# Patient Record
Sex: Female | Born: 1990 | Race: White | Hispanic: Yes | Marital: Single | Smoking: Never smoker
Health system: Southern US, Community
[De-identification: ages and names within clinical notes are randomized; demographics above are authoritative.]

## PROBLEM LIST (undated history)

## (undated) DIAGNOSIS — Z789 Other specified health status: Secondary | ICD-10-CM

## (undated) HISTORY — PX: NO PAST SURGERIES: SHX2092

---
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
## 2016-02-10 ENCOUNTER — Encounter: Payer: Self-pay | Admitting: *Deleted

## 2016-02-10 ENCOUNTER — Ambulatory Visit
Admission: EM | Admit: 2016-02-10 | Discharge: 2016-02-10 | Disposition: A | Discharge status: Home / Self Care | Payer: 59 | Attending: Family Medicine | Admitting: Family Medicine

## 2016-02-10 DIAGNOSIS — Z202 Contact with and (suspected) exposure to infections with a predominantly sexual mode of transmission: Secondary | ICD-10-CM

## 2016-02-10 LAB — URINALYSIS COMPLETE WITH MICROSCOPIC (ARMC ONLY)
BILIRUBIN URINE: NEGATIVE
GLUCOSE, UA: NEGATIVE mg/dL
Ketones, ur: NEGATIVE mg/dL
LEUKOCYTES UA: NEGATIVE
NITRITE: NEGATIVE
pH: 7 (ref 5.0–8.0)

## 2016-02-10 LAB — PREGNANCY, URINE: Preg Test, Ur: NEGATIVE

## 2016-02-10 MED ORDER — CEFTRIAXONE SODIUM 250 MG IJ SOLR
250.0000 mg | Freq: Once | INTRAMUSCULAR | Status: AC
  Administered 2016-02-10: 250 mg via INTRAMUSCULAR

## 2016-02-10 MED ORDER — AZITHROMYCIN 500 MG PO TABS
1000.0000 mg | ORAL_TABLET | Freq: Once | ORAL | Status: AC
  Administered 2016-02-10: 1000 mg via ORAL

## 2016-02-10 MED ORDER — AZITHROMYCIN 500 MG PO TABS: 1000.0000 mg | ORAL_TABLET | Freq: Every day | ORAL | Status: DC

## 2016-02-10 NOTE — Discharge Instructions (Signed)
Follow up with your primary care physician or Health Department in 7-10 days as discussed. No sexually activity until follow up.   Return to Urgent care for new or worsening concerns.    Sexually Transmitted Disease A sexually transmitted disease (STD) is a disease or infection that may be passed (transmitted) from person to person, usually during sexual activity. This may happen by way of saliva, semen, blood, vaginal mucus, or urine. Common STDs include:  Gonorrhea.  Chlamydia.  Syphilis.  HIV and AIDS.  Genital herpes.  Hepatitis B and C.  Trichomonas.  Human papillomavirus (HPV).  Pubic lice.  Scabies.  Mites.  Bacterial vaginosis. WHAT ARE CAUSES OF STDs? An STD may be caused by bacteria, a virus, or parasites. STDs are often transmitted during sexual activity if one person is infected. However, they may also be transmitted through nonsexual means. STDs may be transmitted after:   Sexual intercourse with an infected person.  Sharing sex toys with an infected person.  Sharing needles with an infected person or using unclean piercing or tattoo needles.  Having intimate contact with the genitals, mouth, or rectal areas of an infected person.  Exposure to infected fluids during birth. WHAT ARE THE SIGNS AND SYMPTOMS OF STDs? Different STDs have different symptoms. Some people may not have any symptoms. If symptoms are present, they may include:  Painful or bloody urination.  Pain in the pelvis, abdomen, vagina, anus, throat, or eyes.  A skin rash, itching, or irritation.  Growths, ulcerations, blisters, or sores in the genital and anal areas.  Abnormal vaginal discharge with or without bad odor.  Penile discharge in men.  Fever.  Pain or bleeding during sexual intercourse.  Swollen glands in the groin area.  Yellow skin and eyes (jaundice). This is seen with hepatitis.  Swollen testicles.  Infertility.  Sores and blisters in the mouth. HOW  ARE STDs DIAGNOSED? To make a diagnosis, your health care provider may:  Take a medical history.  Perform a physical exam.  Take a sample of any discharge to examine.  Swab the throat, cervix, opening to the penis, rectum, or vagina for testing.  Test a sample of your first morning urine.  Perform blood tests.  Perform a Pap test, if this applies.  Perform a colposcopy.  Perform a laparoscopy. HOW ARE STDs TREATED? Treatment depends on the STD. Some STDs may be treated but not cured.  Chlamydia, gonorrhea, trichomonas, and syphilis can be cured with antibiotic medicine.  Genital herpes, hepatitis, and HIV can be treated, but not cured, with prescribed medicines. The medicines lessen symptoms.  Genital warts from HPV can be treated with medicine or by freezing, burning (electrocautery), or surgery. Warts may come back.  HPV cannot be cured with medicine or surgery. However, abnormal areas may be removed from the cervix, vagina, or vulva.  If your diagnosis is confirmed, your recent sexual partners need treatment. This is true even if they are symptom-free or have a negative culture or evaluation. They should not have sex until their health care providers say it is okay.  Your health care provider may test you for infection again 3 months after treatment. HOW CAN I REDUCE MY RISK OF GETTING AN STD? Take these steps to reduce your risk of getting an STD:  Use latex condoms, dental dams, and water-soluble lubricants during sexual activity. Do not use petroleum jelly or oils.  Avoid having multiple sex partners.  Do not have sex with someone who has other sex  partners  Do not have sex with anyone you do not know or who is at high risk for an STD.  Avoid risky sex practices that can break your skin.  Do not have sex if you have open sores on your mouth or skin.  Avoid drinking too much alcohol or taking illegal drugs. Alcohol and drugs can affect your judgment and put you  in a vulnerable position.  Avoid engaging in oral and anal sex acts.  Get vaccinated for HPV and hepatitis. If you have not received these vaccines in the past, talk to your health care provider about whether one or both might be right for you.  If you are at risk of being infected with HIV, it is recommended that you take a prescription medicine daily to prevent HIV infection. This is called pre-exposure prophylaxis (PrEP). You are considered at risk if:  You are a man who has sex with other men (MSM).  You are a heterosexual man or woman and are sexually active with more than one partner.  You take drugs by injection.  You are sexually active with a partner who has HIV.  Talk with your health care provider about whether you are at high risk of being infected with HIV. If you choose to begin PrEP, you should first be tested for HIV. You should then be tested every 3 months for as long as you are taking PrEP. WHAT SHOULD I DO IF I THINK I HAVE AN STD?  See your health care provider.  Tell your sexual partner(s). They should be tested and treated for any STDs.  Do not have sex until your health care provider says it is okay. WHEN SHOULD I GET IMMEDIATE MEDICAL CARE? Contact your health care provider right away if:   You have severe abdominal pain.  You are a man and notice swelling or pain in your testicles.  You are a woman and notice swelling or pain in your vagina.   This information is not intended to replace advice given to you by your health care provider. Make sure you discuss any questions you have with your health care provider.   Document Released: 01/02/2003 Document Revised: 11/02/2014 Document Reviewed: 05/02/2013 Elsevier Interactive Patient Education [employer]

## 2016-02-10 NOTE — ED Provider Notes (Addendum)
[facility]  ____________________________________________  Time seen: Approximately 8:33 PM  I have reviewed the triage vital signs and the nursing notes.   HISTORY  Chief Complaint Exposure to STD   HPI Bethany Wright is a 25 y.o. female presents with a complaint of STD exposure. Patient reports that her boyfriend was informed today that he tested positive for chlamydia 3 days ago. Patient reports she did not have any symptoms. Patient reports that she is currently sexually active with 1 partner. Patient reports that she has been sexually active with the same partner for the last 7 months. Patient states that they do not use condoms. Patient denies any oral contraceptives or other birth control method. Patient reports last sexual encounter was 2-3 weeks ago. Patient reports that she is currently on her menstrual at this time.  Denies any pain, vaginal pain, vaginal discharge, vaginal odor, vaginal rash or lesions, abdominal pain, fevers, chest pain, shortness of breath, neck pain, back pain, dysuria, rash or other complaints. Denies history of STDs.   Patient's last menstrual period was 02/08/2016. Current. Denies concern for pregnancy.    History reviewed. No pertinent past medical history. denies There are no active problems to display for this patient. denies  History reviewed. No pertinent past surgical history. denies No current outpatient prescriptions on file. denies Allergies Review of patient's allergies indicates no known allergies.  No family history on file.  Social History Social History  Substance Use Topics  . Smoking status: Never Smoker   . Smokeless tobacco: None  . Alcohol Use: No    Review of Systems Constitutional: No fever/chills Eyes: No visual changes. ENT: No sore throat. Cardiovascular: Denies chest pain. Respiratory: Denies shortness of breath. Gastrointestinal: No abdominal pain.  No nausea, no vomiting.  No diarrhea.  No  constipation. Genitourinary: Negative for dysuria. Musculoskeletal: Negative for back pain. Skin: Negative for rash. Neurological: Negative for headaches, focal weakness or numbness.  10-point ROS otherwise negative.  ____________________________________________   PHYSICAL EXAM:  VITAL SIGNS: ED Triage Vitals  Enc Vitals Group     BP 02/10/16 1838 135/97 mmHg     Pulse Rate 02/10/16 1838 63     Resp -- 18     Temp 02/10/16 1838 98.1 F (36.7 C)     Temp Source 02/10/16 1838 Oral     SpO2 02/10/16 1838 100 %     Weight 02/10/16 1838 180 lb (81.647 kg)     Height 02/10/16 1838 5' 7.5" (1.715 m)     Head Cir --      Peak Flow --      Pain Score --      Pain Loc --      Pain Edu? --      Excl. in GC? --     Constitutional: Alert and oriented. Well appearing and in no acute distress. Eyes: Conjunctivae are normal. PERRL. EOMI. Head: Atraumatic.  Ears: no erythema, normal TMs bilaterally.   Nose: No congestion/rhinnorhea.  Mouth/Throat: Mucous membranes are moist.  Oropharynx non-erythematous. Neck: No stridor.  No cervical spine tenderness to palpation. Hematological/Lymphatic/Immunilogical: No cervical lymphadenopathy. Cardiovascular: Normal rate, regular rhythm. Grossly normal heart sounds.  Good peripheral circulation. Respiratory: Normal respiratory effort.  No retractions. Lungs CTAB. Gastrointestinal: Soft and nontender. Normal Bowel sounds.  No CVA tenderness. Pelvic : Patient refused  Musculoskeletal: No lower or upper extremity tenderness nor edema.  Neurologic:  Normal speech and language. No gross focal neurologic deficits are appreciated. No gait instability. Skin:  Skin is warm, dry and intact. No rash noted. Psychiatric: Mood and affect are normal. Speech and behavior are normal.  ____________________________________________   LABS (all labs ordered are listed, but only abnormal results are displayed)  Labs Reviewed  URINALYSIS COMPLETEWITH  MICROSCOPIC ([facility] ONLY) - Abnormal; Notable for the following:    Specific Gravity, Urine >1.030 (*)    Hgb urine dipstick 2+ (*)    Protein, ur TRACE (*)    Bacteria, UA FEW (*)    Squamous Epithelial / LPF 0-5 (*)    All other components within normal limits  CHLAMYDIA/NGC RT PCR ([facility] ONLY)  URINE CULTURE  PREGNANCY, URINE  HIV ANTIBODY (ROUTINE TESTING)  RPR  HSV(HERPES SIMPLEX VRS) I + II AB-IGG  HSV(HERPES SIMPLEX VRS) I + II AB-IGM    INITIAL IMPRESSION / ASSESSMENT AND PLAN / ED COURSE  Pertinent labs & imaging results that were available during my care of the patient were reviewed by me and considered in my medical decision making (see chart for details).  Very well-appearing patient. No acute distress. Presents with a complaint of STD exposure. Patient presented her boyfriend has a positive for chlamydia. Patient denies any complaints or symptoms. As patient with positive exposure will treat with 250 mg IM Rocephin as well as 1000 mg oral azithromycin once in urgent care. Discussed testing for other STDs as well. Patient denies any symptoms. Patient states that she is currently on her menstrual cycle and does not want a pelvic exam performed. Discussed evaluation and discussed that pelvic slides would be more accurate in testing, patient states that she does not want a pelvic exam at this time. Will treat for chlamydia. Will also evaluate for gonorrhea and chlamydia by urine, RPR, herpes, HIV.    Urinalysis positive for few bacteria, 0-5 squamous epithelial cells, 0-5 WBCs with clear appearance. Too numerous to count RBCs as well as 2+hemoglobin present. Patient is currently on menstrual cycle. Suspect contamination. As patient with out dysuria symptoms will await urinary culture prior to initiating treatment.  Discussed in detail with patient regarding pelvic rest and no sexual intercourse for at least 2 weeks until follow-up to ensure clearance. Encourage patient to  follow-up with primary care physician or [facility] Department and 7-10 days. Also discussed as patient states that she does not want to become pregnant at this time, encouraged patient to follow-up regarding oral birth control which she states she is interested in as well as encouraged barrier protection. Encouraged close follow-up as discussed.  Discussed follow up with Primary care physician this week. Discussed follow up and return parameters including no resolution or any worsening concerns. Patient verbalized understanding and agreed to plan.   ____________________________________________   FINAL CLINICAL IMPRESSION(S) / ED DIAGNOSES  Final diagnoses:  STD exposure      Note: This dictation was prepared with Dragon dictation along with smaller phrase technology. Any transcriptional errors that result from this process are unintentional.    Renford Dills, NP 02/10/16 2123  Renford Dills, NP 02/13/16 [phone]

## 2016-02-10 NOTE — ED Notes (Signed)
Pt states that her boyfriend tested positive for chlamydia, Pt states that she does not have any symptoms but would like to be tested

## 2016-02-11 LAB — CHLAMYDIA/NGC RT PCR (ARMC ONLY)
Chlamydia Tr: DETECTED — AB
N GONORRHOEAE: NOT DETECTED

## 2016-02-12 LAB — URINE CULTURE

## 2016-02-12 LAB — RPR: RPR Ser Ql: NONREACTIVE

## 2016-02-12 LAB — HIV ANTIBODY (ROUTINE TESTING W REFLEX): HIV Screen 4th Generation wRfx: NONREACTIVE

## 2016-02-12 LAB — HSV(HERPES SIMPLEX VRS) I + II AB-IGG: HSV 1 GLYCOPROTEIN G AB, IGG: 24.9 {index} — AB (ref 0.00–0.90)

## 2016-02-12 LAB — HSV(HERPES SIMPLEX VRS) I + II AB-IGM: HSVI/II COMB AB IGM: 4.43 ratio — AB (ref 0.00–0.90)

## 2016-02-13 ENCOUNTER — Telehealth: Payer: Self-pay | Admitting: Emergency Medicine

## 2016-02-13 MED ORDER — VALACYCLOVIR HCL 1 G PO TABS: 1000.0000 mg | ORAL_TABLET | Freq: Two times a day (BID) | ORAL | Status: AC

## 2016-02-13 NOTE — ED Notes (Signed)
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  Patient was instructed to go ahead and start this medicine and to take it as directed for 10 days.  Patient was also instructed to follow-up with her PCP.  Patient verbalized understanding.

## 2017-10-28 DIAGNOSIS — B372 Candidiasis of skin and nail: Secondary | ICD-10-CM | POA: Diagnosis not present

## 2017-11-28 DIAGNOSIS — J069 Acute upper respiratory infection, unspecified: Secondary | ICD-10-CM | POA: Diagnosis not present

## 2018-01-17 DIAGNOSIS — M9902 Segmental and somatic dysfunction of thoracic region: Secondary | ICD-10-CM | POA: Diagnosis not present

## 2018-01-17 DIAGNOSIS — M9901 Segmental and somatic dysfunction of cervical region: Secondary | ICD-10-CM | POA: Diagnosis not present

## 2018-01-17 DIAGNOSIS — M542 Cervicalgia: Secondary | ICD-10-CM | POA: Diagnosis not present

## 2018-01-17 DIAGNOSIS — M546 Pain in thoracic spine: Secondary | ICD-10-CM | POA: Diagnosis not present

## 2018-01-21 DIAGNOSIS — M542 Cervicalgia: Secondary | ICD-10-CM | POA: Diagnosis not present

## 2018-01-21 DIAGNOSIS — M9901 Segmental and somatic dysfunction of cervical region: Secondary | ICD-10-CM | POA: Diagnosis not present

## 2018-01-21 DIAGNOSIS — M546 Pain in thoracic spine: Secondary | ICD-10-CM | POA: Diagnosis not present

## 2018-01-21 DIAGNOSIS — M9902 Segmental and somatic dysfunction of thoracic region: Secondary | ICD-10-CM | POA: Diagnosis not present

## 2018-01-28 DIAGNOSIS — M9901 Segmental and somatic dysfunction of cervical region: Secondary | ICD-10-CM | POA: Diagnosis not present

## 2018-01-28 DIAGNOSIS — M9902 Segmental and somatic dysfunction of thoracic region: Secondary | ICD-10-CM | POA: Diagnosis not present

## 2018-01-28 DIAGNOSIS — M542 Cervicalgia: Secondary | ICD-10-CM | POA: Diagnosis not present

## 2018-01-28 DIAGNOSIS — M546 Pain in thoracic spine: Secondary | ICD-10-CM | POA: Diagnosis not present

## 2018-05-25 DIAGNOSIS — J039 Acute tonsillitis, unspecified: Secondary | ICD-10-CM | POA: Diagnosis not present

## 2018-05-25 DIAGNOSIS — J069 Acute upper respiratory infection, unspecified: Secondary | ICD-10-CM | POA: Diagnosis not present

## 2018-05-25 DIAGNOSIS — J029 Acute pharyngitis, unspecified: Secondary | ICD-10-CM | POA: Diagnosis not present

## 2018-08-12 DIAGNOSIS — Z23 Encounter for immunization: Secondary | ICD-10-CM | POA: Diagnosis not present

## 2018-08-24 DIAGNOSIS — S8991XA Unspecified injury of right lower leg, initial encounter: Secondary | ICD-10-CM | POA: Diagnosis not present

## 2019-02-22 ENCOUNTER — Other Ambulatory Visit: Payer: Self-pay

## 2019-02-22 ENCOUNTER — Encounter: Payer: Self-pay | Admitting: Obstetrics & Gynecology

## 2019-02-22 ENCOUNTER — Ambulatory Visit (INDEPENDENT_AMBULATORY_CARE_PROVIDER_SITE_OTHER): Payer: BLUE CROSS/BLUE SHIELD | Admitting: Obstetrics & Gynecology

## 2019-02-22 VITALS — BP 130/80 | Ht 67.5 in | Wt 223.0 lb

## 2019-02-22 DIAGNOSIS — R350 Frequency of micturition: Secondary | ICD-10-CM | POA: Diagnosis not present

## 2019-02-22 DIAGNOSIS — Z3049 Encounter for surveillance of other contraceptives: Secondary | ICD-10-CM

## 2019-02-22 DIAGNOSIS — Z3046 Encounter for surveillance of implantable subdermal contraceptive: Secondary | ICD-10-CM

## 2019-02-22 LAB — POCT URINALYSIS DIPSTICK
Bilirubin, UA: NEGATIVE
Blood, UA: NEGATIVE
Glucose, UA: NEGATIVE
Ketones, UA: NEGATIVE
Leukocytes, UA: NEGATIVE
Nitrite, UA: NEGATIVE
Protein, UA: NEGATIVE
Spec Grav, UA: 1.01 (ref 1.010–1.025)
Urobilinogen, UA: 0.2 E.U./dL
pH, UA: 5 (ref 5.0–8.0)

## 2019-02-22 NOTE — Progress Notes (Signed)
  Nexplanon removal Procedure note - The Nexplanon was noted in the patient's arm and the end was identified. The skin was cleansed with a Betadine solution. A small injection of subcutaneous lidocaine with epinephrine was given over the end of the implant. An incision was made at the end of the implant. The rod was noted in the incision and grasped with a hemostat. It was noted to be intact.  Steri-Strip was placed approximating the incision. Hemostasis was noted.  Pt is counseled as to birth control.  No pregnancy desired as of yet.  Wants to avoid hormonal options for a while.  Paraguard discussed as option.   She had rare period on Nexplanon and wants to see what her periods will do naturally at this time.  Letitia Libra ,MD 02/22/2019,3:07 PM

## 2019-02-22 NOTE — Progress Notes (Signed)
  HPI:      [name] is a 28 y.o. G1P1001 who LMP was Patient's last menstrual period was 10/23/2018., presents today for a problem visit.    Urinary Tract Infection: Patient complains of frequency and urgency . She has had symptoms for 3 days. Patient also complains of no other complaint. Patient denies fever and dysuria. Patient does not have a history of recurrent UTI.  Patient does not have a history of pyelonephritis.   PMHx: She  has no past medical history on file. Also,  has no past surgical history on file., family history includes Hypertension in her mother.,  reports that she has never smoked. She has never used smokeless tobacco. She reports that she does not drink alcohol or use drugs.  She currently has no medications in their medication list. Also, has No Known Allergies.  Review of Systems  Constitutional: Negative for chills, fever and malaise/fatigue.  HENT: Negative for congestion, sinus pain and sore throat.   Eyes: Negative for blurred vision and pain.  Respiratory: Negative for cough and wheezing.   Cardiovascular: Negative for chest pain and leg swelling.  Gastrointestinal: Negative for abdominal pain, constipation, diarrhea, heartburn, nausea and vomiting.  Genitourinary: Negative for dysuria, frequency, hematuria and urgency.  Musculoskeletal: Negative for back pain, joint pain, myalgias and neck pain.  Skin: Negative for itching and rash.  Neurological: Negative for dizziness, tremors and weakness.  Endo/Heme/Allergies: Does not bruise/bleed easily.  Psychiatric/Behavioral: Negative for depression. The patient is not nervous/anxious and does not have insomnia.     Objective: BP 130/80   Ht 5' 7.5" (1.715 m)   Wt 223 lb (101.2 kg)   LMP 10/23/2018   BMI 34.41 kg/m  Physical Exam Constitutional:      General: She is not in acute distress.    Appearance: She is well-developed.  Musculoskeletal: Normal range of motion.  Neurological:     Mental  Status: She is alert and oriented to person, place, and time.  Skin:    General: Skin is warm and dry.  Vitals signs reviewed.   Back no CVAT  Results for orders placed or performed in visit on 02/22/19  POCT urinalysis dipstick  Result Value Ref Range   Color, UA     Clarity, UA     Glucose, UA Negative Negative   Bilirubin, UA neg    Ketones, UA neg    Spec Grav, UA 1.010 1.010 - 1.025   Blood, UA neg    pH, UA 5.0 5.0 - 8.0   Protein, UA Negative Negative   Urobilinogen, UA 0.2 0.2 or 1.0 E.U./dL   Nitrite, UA neg    Leukocytes, UA Negative Negative   Appearance     Odor      ASSESSMENT/PLAN:  Urgency and Frequency normal urology findings, no UTI  Problem List Items Addressed This Visit    Urine frequency    -  Primary   Relevant Orders   POCT urinalysis dipstick (Completed)   Nexplanon removal                   See Note  Plan to monitor urinary sx's for now.  Plan no BC, considering Paraguard to avoid hormones for a while  Annamarie Major, MD, [facility], [facility] 02/22/2019  3:09 PM

## 2019-04-03 ENCOUNTER — Ambulatory Visit (INDEPENDENT_AMBULATORY_CARE_PROVIDER_SITE_OTHER): Payer: BC Managed Care – PPO | Admitting: Obstetrics & Gynecology

## 2019-04-03 ENCOUNTER — Other Ambulatory Visit: Payer: Self-pay

## 2019-04-03 ENCOUNTER — Other Ambulatory Visit (HOSPITAL_COMMUNITY)
Admission: RE | Admit: 2019-04-03 | Discharge: 2019-04-03 | Disposition: A | Discharge status: Home / Self Care | Payer: BC Managed Care – PPO | Source: Ambulatory Visit | Attending: Obstetrics & Gynecology | Admitting: Obstetrics & Gynecology

## 2019-04-03 ENCOUNTER — Encounter: Payer: Self-pay | Admitting: Obstetrics & Gynecology

## 2019-04-03 VITALS — BP 130/80 | Ht 67.5 in | Wt 222.0 lb

## 2019-04-03 DIAGNOSIS — Z124 Encounter for screening for malignant neoplasm of cervix: Secondary | ICD-10-CM | POA: Insufficient documentation

## 2019-04-03 DIAGNOSIS — Z01419 Encounter for gynecological examination (general) (routine) without abnormal findings: Secondary | ICD-10-CM

## 2019-04-03 NOTE — Progress Notes (Signed)
HPI:      Bethany Wright is a 28 y.o. G1P1001 who LMP was Patient's last menstrual period was 03/14/2019., she presents today for her annual examination. The patient has no complaints today. The patient is sexually active. Her last pap: was normal. The patient does perform self breast exams.  There is no notable family history of breast or ovarian cancer in her family.  The patient has regular exercise: yes.  The patient denies current symptoms of depression.    GYN History: Contraception: condoms  PMHx: History reviewed. No pertinent past medical history. History reviewed. No pertinent surgical history. Family History  Problem Relation Age of Onset  . Hypertension Mother    Social History   Tobacco Use  . Smoking status: Never Smoker  . Smokeless tobacco: Never Used  Substance Use Topics  . Alcohol use: No  . Drug use: No   No current outpatient medications on file. Allergies: Patient has no known allergies.  Review of Systems  Constitutional: Negative for chills, fever and malaise/fatigue.  HENT: Negative for congestion, sinus pain and sore throat.   Eyes: Negative for blurred vision and pain.  Respiratory: Negative for cough and wheezing.   Cardiovascular: Negative for chest pain and leg swelling.  Gastrointestinal: Negative for abdominal pain, constipation, diarrhea, heartburn, nausea and vomiting.  Genitourinary: Negative for dysuria, frequency, hematuria and urgency.  Musculoskeletal: Negative for back pain, joint pain, myalgias and neck pain.  Skin: Negative for itching and rash.  Neurological: Negative for dizziness, tremors and weakness.  Endo/Heme/Allergies: Does not bruise/bleed easily.  Psychiatric/Behavioral: Negative for depression. The patient is not nervous/anxious and does not have insomnia.     Objective: BP 130/80   Ht 5' 7.5" (1.715 m)   Wt 222 lb (100.7 kg)   LMP 03/14/2019   BMI 34.26 kg/m   Filed Weights   04/03/19 1338  Weight: 222 lb  (100.7 kg)   Body mass index is 34.26 kg/m. Physical Exam Constitutional:      General: She is not in acute distress.    Appearance: She is well-developed.  Genitourinary:     Pelvic exam was performed with patient supine.     Vagina, uterus and rectum normal.     No lesions in the vagina.     No vaginal bleeding.     No cervical motion tenderness, friability, lesion or polyp.     Uterus is mobile.     Uterus is not enlarged.     No uterine mass detected.    Uterus is midaxial.     No right or left adnexal mass present.     Right adnexa not tender.     Left adnexa not tender.  HENT:     Head: Normocephalic and atraumatic. No laceration.     Right Ear: Hearing normal.     Left Ear: Hearing normal.     Mouth/Throat:     Pharynx: Uvula midline.  Eyes:     Pupils: Pupils are equal, round, and reactive to light.  Neck:     Musculoskeletal: Normal range of motion and neck supple.     Thyroid: No thyromegaly.  Cardiovascular:     Rate and Rhythm: Normal rate and regular rhythm.     Heart sounds: No murmur. No friction rub. No gallop.   Pulmonary:     Effort: Pulmonary effort is normal. No respiratory distress.     Breath sounds: Normal breath sounds. No wheezing.  Chest:  Breasts:        Right: No mass, skin change or tenderness.        Left: No mass, skin change or tenderness.  Abdominal:     General: Bowel sounds are normal. There is no distension.     Palpations: Abdomen is soft.     Tenderness: There is no abdominal tenderness. There is no rebound.  Musculoskeletal: Normal range of motion.  Neurological:     Mental Status: She is alert and oriented to person, place, and time.     Cranial Nerves: No cranial nerve deficit.  Skin:    General: Skin is warm and dry.  Psychiatric:        Judgment: Judgment normal.  Vitals signs reviewed.     Assessment:  ANNUAL EXAM 1. Women's annual routine gynecological examination   2. Screening for cervical cancer       Screening Plan:            1.  Cervical Screening-  Pap smear done today  2. Breast screening- Exam annually and mammogram>40 planned   3. Labs managed by PCP  4. Counseling for contraception: condoms  Prior Nexplanon, but likes to have periods Prior Mirena, too much irreg bleeding Considering Paraguard in future    F/U  Return in about 1 year (around 04/02/2020) for Annual.  Annamarie MajorPaul Legend Tumminello, MD, [facility], [facility] 04/03/2019  1:56 PM

## 2019-04-06 ENCOUNTER — Telehealth: Payer: Self-pay

## 2019-04-06 LAB — CYTOLOGY - PAP
Adequacy: ABSENT
Diagnosis: NEGATIVE

## 2019-04-06 NOTE — Telephone Encounter (Signed)
Yes I left message with her to let us know if she has BV sx's (otherwise PAP is normal No Cancer).

## 2019-04-06 NOTE — Telephone Encounter (Signed)
Pt calling triage stating she seen her abnormal pap results, all I see is possible Bv ? Please advise

## 2019-04-10 ENCOUNTER — Other Ambulatory Visit: Payer: Self-pay | Admitting: Obstetrics & Gynecology

## 2019-04-10 MED ORDER — METRONIDAZOLE 0.75 % VA GEL: 1.0000 | Freq: Every day | VAGINAL | 0 refills | Status: DC

## 2019-04-10 NOTE — Telephone Encounter (Signed)
Can you send in rx for BV, pt reports discharge with slight odor

## 2019-04-19 ENCOUNTER — Telehealth: Payer: Self-pay

## 2019-04-19 NOTE — Telephone Encounter (Signed)
Pt needs appt if still having sx/issues.

## 2019-04-19 NOTE — Telephone Encounter (Signed)
Pt calling; finished 5d course of metrogel on Fri.  Now feels like she has something else b/c it's different.  [phone]  Pt states yesterday she started feeling like a burning and has white clumpy d/c.

## 2019-04-19 NOTE — Telephone Encounter (Signed)
Yes

## 2019-04-20 NOTE — Telephone Encounter (Signed)
Adv pt she needs to be seen; no availability this week; in meantime can use monistat. Tx'd to SP to schedule.

## 2019-04-25 ENCOUNTER — Encounter: Payer: Self-pay | Admitting: Obstetrics and Gynecology

## 2019-04-25 ENCOUNTER — Other Ambulatory Visit: Payer: Self-pay

## 2019-04-25 ENCOUNTER — Ambulatory Visit (INDEPENDENT_AMBULATORY_CARE_PROVIDER_SITE_OTHER): Payer: BC Managed Care – PPO | Admitting: Obstetrics and Gynecology

## 2019-04-25 VITALS — BP 118/78 | Ht 67.0 in | Wt 226.0 lb

## 2019-04-25 DIAGNOSIS — N898 Other specified noninflammatory disorders of vagina: Secondary | ICD-10-CM | POA: Diagnosis not present

## 2019-04-25 LAB — POCT WET PREP WITH KOH
Clue Cells Wet Prep HPF POC: NEGATIVE
KOH Prep POC: NEGATIVE
Trichomonas, UA: NEGATIVE
Yeast Wet Prep HPF POC: NEGATIVE

## 2019-04-25 NOTE — Progress Notes (Signed)
Patient, No Pcp Per   Chief Complaint  Patient presents with  . Vaginitis    pt still having vaginal d/c after medication.     HPI:      Ms. Phyllicia Dudek is a 28 y.o. G1P1001 who LMP was Patient's last menstrual period was 04/11/2019., presents today for vaginal d/c without irritation/odor since last wk. Pt treated for BV with metrogel 04/10/19 after pt had sx and pap suggestive of BV. Pt states odor resolved but then had vaginal irritation and d/c that was white and clumpy. Didn't feel like yeast vag but treated with monistat-3 with sx relief of irritation. Still has d/c, but not excessive. Pt wants to make sure no infection.  Pt is sex active, using condoms. Same partner for 4 yrs.   History reviewed. No pertinent past medical history.  History reviewed. No pertinent surgical history.  Family History  Problem Relation Age of Onset  . Hypertension Mother     Social History   Socioeconomic History  . Marital status: Single    Spouse name: Not on file  . Number of children: Not on file  . Years of education: Not on file  . Highest education level: Not on file  Occupational History  . Not on file  Social Needs  . Financial resource strain: Not on file  . Food insecurity    Worry: Not on file    Inability: Not on file  . Transportation needs    Medical: Not on file    Non-medical: Not on file  Tobacco Use  . Smoking status: Never Smoker  . Smokeless tobacco: Never Used  Substance and Sexual Activity  . Alcohol use: No  . Drug use: No  . Sexual activity: Yes  Lifestyle  . Physical activity    Days per week: Not on file    Minutes per session: Not on file  . Stress: Not on file  Relationships  . Social Herbalist on phone: Not on file    Gets together: Not on file    Attends religious service: Not on file    Active member of club or organization: Not on file    Attends meetings of clubs or organizations: Not on file    Relationship status: Not  on file  . Intimate partner violence    Fear of current or ex partner: Not on file    Emotionally abused: Not on file    Physically abused: Not on file    Forced sexual activity: Not on file  Other Topics Concern  . Not on file  Social History Narrative  . Not on file    No outpatient medications prior to visit.   No facility-administered medications prior to visit.       ROS:  Review of Systems  Constitutional: Negative for fever.  Gastrointestinal: Negative for blood in stool, constipation, diarrhea, nausea and vomiting.  Genitourinary: Positive for vaginal discharge. Negative for dyspareunia, dysuria, flank pain, frequency, hematuria, urgency, vaginal bleeding and vaginal pain.  Musculoskeletal: Negative for back pain.  Skin: Negative for rash.   BREAST: No symptoms   OBJECTIVE:   Vitals:  BP 118/78   Ht 5\' 7"  (1.702 m)   Wt 226 lb (102.5 kg)   LMP 04/11/2019   BMI 35.40 kg/m   Physical Exam Vitals signs reviewed.  Constitutional:      Appearance: She is well-developed.  Neck:     Musculoskeletal: Normal range of motion.  Pulmonary:  Effort: Pulmonary effort is normal.  Genitourinary:    General: Normal vulva.     Pubic Area: No rash.      Labia:        Right: No rash, tenderness or lesion.        Left: No rash, tenderness or lesion.      Vagina: Normal. No vaginal discharge, erythema or tenderness.     Cervix: Normal.     Uterus: Normal. Not enlarged and not tender.      Adnexa: Right adnexa normal and left adnexa normal.       Right: No mass or tenderness.         Left: No mass or tenderness.    Musculoskeletal: Normal range of motion.  Skin:    General: Skin is warm and dry.  Neurological:     General: No focal deficit present.     Mental Status: She is alert and oriented to person, place, and time.  Psychiatric:        Mood and Affect: Mood normal.        Behavior: Behavior normal.        Thought Content: Thought content normal.         Judgment: Judgment normal.     Results: Results for orders placed or performed in visit on 04/25/19 (from the past 24 hour(s))  POCT Wet Prep with KOH     Status: Normal   Collection Time: 04/25/19 11:58 AM  Result Value Ref Range   Trichomonas, UA Negative    Clue Cells Wet Prep HPF POC neg    Epithelial Wet Prep HPF POC     Yeast Wet Prep HPF POC neg    Bacteria Wet Prep HPF POC     RBC Wet Prep HPF POC     WBC Wet Prep HPF POC     KOH Prep POC Negative Negative     Assessment/Plan: Vaginal discharge - Plan: POCT Wet Prep with KOH, Neg wet prep/ no other sx. Reassurance. F/u if sx change/prn. Can check culture if needed.      Return if symptoms worsen or fail to improve.  Froilan Mclean B. Sher Shampine, PA-C 04/25/2019 11:59 AM

## 2019-04-25 NOTE — Patient Instructions (Signed)
I value your feedback and entrusting us with your care. If you get a [facility] patient survey, I would appreciate you taking the time to let us know about your experience today. Thank you! 

## 2019-05-16 DIAGNOSIS — J039 Acute tonsillitis, unspecified: Secondary | ICD-10-CM | POA: Diagnosis not present

## 2019-06-15 ENCOUNTER — Other Ambulatory Visit: Payer: Self-pay

## 2019-06-15 ENCOUNTER — Telehealth: Payer: Self-pay

## 2019-06-15 DIAGNOSIS — Z20822 Contact with and (suspected) exposure to covid-19: Secondary | ICD-10-CM

## 2019-06-15 NOTE — Telephone Encounter (Signed)
Pt called stating she is experiencing the same symptoms she had about a month ago that ended up being BV and was wondering if their was something she could take for it or if she needed to come into the office to see ABC to check on it. Please advise, Thank you

## 2019-06-16 ENCOUNTER — Other Ambulatory Visit: Payer: Self-pay | Admitting: Obstetrics and Gynecology

## 2019-06-16 LAB — NOVEL CORONAVIRUS, NAA: SARS-CoV-2, NAA: NOT DETECTED

## 2019-06-16 MED ORDER — METRONIDAZOLE 0.75 % VA GEL
1.0000 | Freq: Every day | VAGINAL | 0 refills | Status: AC
Start: 2019-06-16 — End: 2019-06-21

## 2019-06-16 NOTE — Telephone Encounter (Signed)
Rx RF metrogel eRxd. Common to get recurrent sx within 3-6 months of BV. Add probiotics. F/u prn.

## 2019-06-16 NOTE — Progress Notes (Signed)
Rx RF metrogel for recurrent BV sx 

## 2019-06-16 NOTE — Telephone Encounter (Signed)
Pt aware.

## 2019-06-19 ENCOUNTER — Other Ambulatory Visit: Payer: Self-pay

## 2019-06-19 DIAGNOSIS — Z20822 Contact with and (suspected) exposure to covid-19: Secondary | ICD-10-CM

## 2019-06-21 LAB — NOVEL CORONAVIRUS, NAA: SARS-CoV-2, NAA: DETECTED — AB

## 2019-07-06 ENCOUNTER — Other Ambulatory Visit: Payer: Self-pay

## 2019-07-06 DIAGNOSIS — Z20822 Contact with and (suspected) exposure to covid-19: Secondary | ICD-10-CM

## 2019-07-07 LAB — NOVEL CORONAVIRUS, NAA: SARS-CoV-2, NAA: NOT DETECTED

## 2020-04-09 ENCOUNTER — Encounter: Payer: Self-pay | Admitting: Obstetrics & Gynecology

## 2020-04-09 ENCOUNTER — Other Ambulatory Visit (HOSPITAL_COMMUNITY)
Admission: RE | Admit: 2020-04-09 | Discharge: 2020-04-09 | Disposition: A | Discharge status: Home / Self Care | Payer: BC Managed Care – PPO | Source: Ambulatory Visit | Attending: Obstetrics & Gynecology | Admitting: Obstetrics & Gynecology

## 2020-04-09 ENCOUNTER — Ambulatory Visit: Payer: Self-pay | Admitting: Obstetrics & Gynecology

## 2020-04-09 ENCOUNTER — Ambulatory Visit (INDEPENDENT_AMBULATORY_CARE_PROVIDER_SITE_OTHER): Payer: BC Managed Care – PPO | Admitting: Obstetrics & Gynecology

## 2020-04-09 ENCOUNTER — Other Ambulatory Visit: Payer: Self-pay

## 2020-04-09 VITALS — BP 120/70 | Ht 67.5 in | Wt 236.0 lb

## 2020-04-09 DIAGNOSIS — N760 Acute vaginitis: Secondary | ICD-10-CM | POA: Diagnosis not present

## 2020-04-09 DIAGNOSIS — Z01419 Encounter for gynecological examination (general) (routine) without abnormal findings: Secondary | ICD-10-CM | POA: Diagnosis not present

## 2020-04-09 DIAGNOSIS — B9689 Other specified bacterial agents as the cause of diseases classified elsewhere: Secondary | ICD-10-CM | POA: Diagnosis present

## 2020-04-09 DIAGNOSIS — Z124 Encounter for screening for malignant neoplasm of cervix: Secondary | ICD-10-CM | POA: Diagnosis present

## 2020-04-09 NOTE — Progress Notes (Signed)
HPI:      [name] is a 29 y.o. G1P1001 who LMP was Patient's last menstrual period was 03/29/2020., she presents today for her annual examination. The patient has no complaints today. The patient is sexually active. Periods reg. Her last pap: was normal. The patient does perform self breast exams.  There is no notable family history of breast or ovarian cancer in her family.  The patient has regular exercise: yes.  The patient denies current symptoms of depression.    GYN History: Contraception: condoms  PMHx: History reviewed. No pertinent past medical history. History reviewed. No pertinent surgical history. Family History  Problem Relation Age of Onset  . Hypertension Mother    Social History   Tobacco Use  . Smoking status: Never Smoker  . Smokeless tobacco: Never Used  Vaping Use  . Vaping Use: Never used  Substance Use Topics  . Alcohol use: No  . Drug use: No   No current outpatient medications on file. Allergies: Patient has no known allergies.  Review of Systems  Constitutional: Negative for chills, fever and malaise/fatigue.  HENT: Negative for congestion, sinus pain and sore throat.   Eyes: Negative for blurred vision and pain.  Respiratory: Negative for cough and wheezing.   Cardiovascular: Negative for chest pain and leg swelling.  Gastrointestinal: Negative for abdominal pain, constipation, diarrhea, heartburn, nausea and vomiting.  Genitourinary: Negative for dysuria, frequency, hematuria and urgency.  Musculoskeletal: Negative for back pain, joint pain, myalgias and neck pain.  Skin: Negative for itching and rash.  Neurological: Negative for dizziness, tremors and weakness.  Endo/Heme/Allergies: Does not bruise/bleed easily.  Psychiatric/Behavioral: Negative for depression. The patient is not nervous/anxious and does not have insomnia.     Objective: BP 120/70   Ht 5' 7.5" (1.715 m)   Wt 236 lb (107 kg)   LMP 03/29/2020   BMI 36.42 kg/m    Filed Weights   04/09/20 1036  Weight: 236 lb (107 kg)   Body mass index is 36.42 kg/m. Physical Exam Constitutional:      General: She is not in acute distress.    Appearance: She is well-developed.  Genitourinary:     Pelvic exam was performed with patient supine.     Vagina, uterus and rectum normal.     No lesions in the vagina.     No vaginal bleeding.     No cervical motion tenderness, friability, lesion or polyp.     Uterus is mobile.     Uterus is not enlarged.     No uterine mass detected.    Uterus is midaxial.     No right or left adnexal mass present.     Right adnexa not tender.     Left adnexa not tender.  HENT:     Head: Normocephalic and atraumatic. No laceration.     Right Ear: Hearing normal.     Left Ear: Hearing normal.     Mouth/Throat:     Pharynx: Uvula midline.  Eyes:     Pupils: Pupils are equal, round, and reactive to light.  Neck:     Thyroid: No thyromegaly.  Cardiovascular:     Rate and Rhythm: Normal rate and regular rhythm.     Heart sounds: No murmur heard.  No friction rub. No gallop.   Pulmonary:     Effort: Pulmonary effort is normal. No respiratory distress.     Breath sounds: Normal breath sounds. No wheezing.  Chest:  Breasts:        Right: No mass, skin change or tenderness.        Left: No mass, skin change or tenderness.  Abdominal:     General: Bowel sounds are normal. There is no distension.     Palpations: Abdomen is soft.     Tenderness: There is no abdominal tenderness. There is no rebound.  Musculoskeletal:        General: Normal range of motion.     Cervical back: Normal range of motion and neck supple.  Neurological:     Mental Status: She is alert and oriented to person, place, and time.     Cranial Nerves: No cranial nerve deficit.  Skin:    General: Skin is warm and dry.  Psychiatric:        Judgment: Judgment normal.  Vitals reviewed.     Assessment:  ANNUAL EXAM 1. Women's annual routine  gynecological examination   2. Screening for cervical cancer   3. BV (bacterial vaginosis)      Screening Plan:            1.  Cervical Screening-  Pap smear done today  2. Breast screening- Exam annually and mammogram>40 planned   3. Colonoscopy every 10 years, Hemoccult testing - after age 82  4. Labs managed by PCP  5. Counseling for contraception: condoms  Considering pregnancy within the year   6. History of BV (bacterial vaginosis) Recent sx's after soap change She has changed back Will test - Cervicovaginal ancillary only      F/U  Return in about 1 year (around 04/09/2021) for Annual.  Annamarie Major, MD, [facility], [facility] 04/09/2020  10:55 AM

## 2020-04-09 NOTE — Patient Instructions (Signed)
PAP every year  Thank you for choosing [facility]. As part of our ongoing efforts to improve patient experience, we would appreciate your feedback. Please fill out the short survey that you will receive by mail or MyChart. Your opinion is important to [place]!

## 2020-04-10 LAB — CYTOLOGY - PAP
Adequacy: ABSENT
Diagnosis: NEGATIVE

## 2020-04-10 LAB — CERVICOVAGINAL ANCILLARY ONLY
Bacterial Vaginitis (gardnerella): NEGATIVE
Candida Glabrata: NEGATIVE
Candida Vaginitis: NEGATIVE
Comment: NEGATIVE
Comment: NEGATIVE
Comment: NEGATIVE

## 2020-10-07 ENCOUNTER — Encounter: Payer: Self-pay | Admitting: Obstetrics & Gynecology

## 2020-10-07 ENCOUNTER — Ambulatory Visit (INDEPENDENT_AMBULATORY_CARE_PROVIDER_SITE_OTHER): Payer: BC Managed Care – PPO | Admitting: Obstetrics & Gynecology

## 2020-10-07 ENCOUNTER — Other Ambulatory Visit: Payer: Self-pay

## 2020-10-07 VITALS — BP 120/78 | HR 78 | Ht 67.5 in | Wt 236.0 lb

## 2020-10-07 DIAGNOSIS — E669 Obesity, unspecified: Secondary | ICD-10-CM | POA: Insufficient documentation

## 2020-10-07 MED ORDER — PHENTERMINE HCL 37.5 MG PO TABS: ORAL_TABLET | ORAL | 0 refills | Status: DC

## 2020-10-07 NOTE — Patient Instructions (Signed)
Phentermine tablets or capsules What is this medicine? PHENTERMINE (FEN ter meen) decreases your appetite. It is used with a reduced calorie diet and exercise to help you lose weight. This medicine may be used for other purposes; ask your health care provider or pharmacist if you have questions. COMMON BRAND NAME(S): Adipex-P, Atti-Plex P, Atti-Plex P Spansule, Fastin, Lomaira, Pro-Fast, Tara-8 What should I tell my health care provider before I take this medicine? They need to know if you have any of these conditions:  agitation or nervousness  diabetes  glaucoma  heart disease  high blood pressure  history of drug abuse or addiction  history of stroke  kidney disease  lung disease called Primary Pulmonary Hypertension (PPH)  taken an MAOI like Carbex, Eldepryl, Marplan, Nardil, or Parnate in last 14 days  taking stimulant medicines for attention disorders, weight loss, or to stay awake  thyroid disease  an unusual or allergic reaction to phentermine, other medicines, foods, dyes, or preservatives  pregnant or trying to get pregnant  breast-feeding How should I use this medicine? Take this medicine by mouth with a glass of water. Follow the directions on the prescription label. Take your medicine at regular intervals. Do not take it more often than directed. Do not stop taking except on your doctor's advice. Talk to your pediatrician regarding the use of this medicine in children. While this drug may be prescribed for children 17 years or older for selected conditions, precautions do apply. Overdosage: If you think you have taken too much of this medicine contact a poison control center or emergency room at once. NOTE: This medicine is only for you. Do not share this medicine with others. What if I miss a dose? If you miss a dose, take it as soon as you can. If it is almost time for your next dose, take only that dose. Do not take double or extra doses. What may interact  with this medicine? Do not take this medicine with any of the following medications:  MAOIs like Carbex, Eldepryl, Marplan, Nardil, and Parnate This medicine may also interact with the following medications:  alcohol  certain medicines for depression, anxiety, or psychotic disorders  certain medicines for high blood pressure  linezolid  medicines for colds or breathing difficulties like pseudoephedrine or phenylephrine  medicines for diabetes  sibutramine  stimulant medicines for attention disorders, weight loss, or to stay awake This list may not describe all possible interactions. Give your health care provider a list of all the medicines, herbs, non-prescription drugs, or dietary supplements you use. Also tell them if you smoke, drink alcohol, or use illegal drugs. Some items may interact with your medicine. What should I watch for while using this medicine? Visit your doctor or health care provider for regular checks on your progress. Do not stop taking except on your health care provider's advice. You may develop a severe reaction. Your health care provider will tell you how much medicine to take. Do not take this medicine close to bedtime. It may prevent you from sleeping. You may get drowsy or dizzy. Do not drive, use machinery, or do anything that needs mental alertness until you know how this medicine affects you. Do not stand or sit up quickly, especially if you are an older patient. This reduces the risk of dizzy or fainting spells. Alcohol may increase dizziness and drowsiness. Avoid alcoholic drinks. This medicine may affect blood sugar levels. Ask your healthcare provider if changes in diet or medicines are needed   if you have diabetes. Women should inform their health care provider if they wish to become pregnant or think they might be pregnant. Losing weight while pregnant is not advised and may cause harm to the unborn child. Talk to your health care provider for more  information. What side effects may I notice from receiving this medicine? Side effects that you should report to your doctor or health care professional as soon as possible:  allergic reactions like skin rash, itching or hives, swelling of the face, lips, or tongue  breathing problems  changes in emotions or moods  changes in vision  chest pain or chest tightness  fast, irregular heartbeat  feeling faint or lightheaded  increased blood pressure  irritable  restlessness  tremors  seizures  signs and symptoms of a stroke like changes in vision; confusion; trouble speaking or understanding; severe headaches; sudden numbness or weakness of the face, arm or leg; trouble walking; dizziness; loss of balance or coordination  unusually weak or tired Side effects that usually do not require medical attention (report to your doctor or health care professional if they continue or are bothersome):  changes in taste  constipation or diarrhea  dizziness  dry mouth  headache  trouble sleeping  upset stomach This list may not describe all possible side effects. Call your doctor for medical advice about side effects. You may report side effects to FDA at 1-800-FDA-1088. Where should I keep my medicine? Keep out of the reach of children. This medicine can be abused. Keep your medicine in a safe place to protect it from theft. Do not share this medicine with anyone. Selling or giving away this medicine is dangerous and against the law. This medicine may cause harm and death if it is taken by other adults, children, or pets. Return medicine that has not been used to an official disposal site. Contact the DEA at 1-800-882-9539 or your city/county government to find a site. If you cannot return the medicine, mix any unused medicine with a substance like cat litter or coffee grounds. Then throw the medicine away in a sealed container like a sealed bag or coffee can with a lid. Do not use the  medicine after the expiration date. Store at room temperature between 20 and 25 degrees C (68 and 77 degrees F). Keep container tightly closed. NOTE: This sheet is a summary. It may not cover all possible information. If you have questions about this medicine, talk to your doctor, pharmacist, or health care provider.  2020 Elsevier/Gold Standard (2019-08-18 12:54:20)  

## 2020-10-07 NOTE — Progress Notes (Signed)
  HPI:  Patient is a 29 y.o. G1P1001 presenting for evaluation of abnormal weight gain.  The patient reports no significant weight change.  This is despite exercise (boot camp) for the last 3 mos and dietary modifications. She has these associated symptoms: none.  She has tried self-directed dieting and exrcise in the past with little success. PMHx: She  has no past medical history on file. Also,  has no past surgical history on file., family history includes Hypertension in her mother.,  reports that she has never smoked. She has never used smokeless tobacco. She reports that she does not drink alcohol and does not use drugs.  She has a current medication list which includes the following prescription(s): phentermine. Also, has No Known Allergies.  Review of Systems  All other systems reviewed and are negative.   Objective: BP 120/78 (BP Location: Right Arm, Patient Position: Sitting, Cuff Size: Large)   Pulse 78   Ht 5' 7.5" (1.715 m)   Wt 236 lb (107 kg)   LMP 10/01/2020   BMI 36.42 kg/m  Physical Exam Constitutional:      General: She is not in acute distress.    Appearance: She is well-developed and well-nourished.  Musculoskeletal:        General: Normal range of motion.  Neurological:     Mental Status: She is alert and oriented to person, place, and time.  Skin:    General: Skin is warm and dry.  Psychiatric:        Mood and Affect: Mood and affect normal.  Vitals reviewed.     ASSESSMENT:  obesity  Plan: Will assist patient in incorporating positive experiences into her life to promote a positive mental attitude.  Education given regarding appropriate lifestyle changes for weight loss, including regular physical activity, healthy coping strategies, caloric restriction, and healthy eating patterns.  Patient is started on prescription appetite suppressants: Phentermine  The risks and benefits as well as side effects of medication, such as Phenteramine or Tenuate, is  discussed.  The pros and cons of suppressing appetite and boosting metabolism is counseled.  Risks of tolerance and addiction discussed.  Use of medicine will be short term.  Pt to call with any negative side effects and agrees to keep follow up appointments.  Annamarie Major, MD, [facility], [facility] 10/07/2020  8:48 AM

## 2020-11-05 ENCOUNTER — Ambulatory Visit (INDEPENDENT_AMBULATORY_CARE_PROVIDER_SITE_OTHER): Payer: BC Managed Care – PPO | Admitting: Obstetrics & Gynecology

## 2020-11-05 ENCOUNTER — Encounter: Payer: Self-pay | Admitting: Obstetrics & Gynecology

## 2020-11-05 ENCOUNTER — Other Ambulatory Visit: Payer: Self-pay

## 2020-11-05 VITALS — Ht 67.5 in | Wt 220.0 lb

## 2020-11-05 DIAGNOSIS — E669 Obesity, unspecified: Secondary | ICD-10-CM | POA: Diagnosis not present

## 2020-11-05 MED ORDER — PHENTERMINE HCL 37.5 MG PO TABS: ORAL_TABLET | ORAL | 1 refills | Status: DC

## 2020-11-05 NOTE — Progress Notes (Signed)
Virtual Visit via Telephone Note  I connected with Bethany Wright on 11/05/20 at  8:20 AM EST by telephone and verified that I am speaking with the correct person using two identifiers.  Location: Patient: Home Provider: Office   I discussed the limitations, risks, security and privacy concerns of performing an evaluation and management service by telephone and the availability of in person appointments. I also discussed with the patient that there may be a patient responsible charge related to this service. The patient expressed understanding and agreed to proceed.   History of Present Illness:  History of Present Illness:  Bethany Wright is a 30 y.o. who was started on Phentermine approximately 1 month ago due to obesity/abnormal weight gain. The patient has lost 16 pounds over the past month due to medicine as well as lifestyle changes..   She has these side effects: dry mouth.  PMHx: She  has no past medical history on file. Also,  has no past surgical history on file., family history includes Hypertension in her mother.,  reports that she has never smoked. She has never used smokeless tobacco. She reports that she does not drink alcohol and does not use drugs.  She has a current medication list which includes the following prescription(s): phentermine. Also, has No Known Allergies.  Review of Systems  All other systems reviewed and are negative.    Observations/Objective: No exam today, due to telephone eVisit due to [facility] virus restriction on elective visits and procedures.  Prior visits reviewed along with ultrasounds/labs as indicated. Home reported weight: Ht 5' 7.5" (1.715 m)   Wt 220 lb (99.8 kg)   BMI 33.95 kg/m   Assessment and Plan:   ICD-10-CM   1. Obesity (BMI 30-39.9)  E66.9 phentermine (ADIPEX-P) 37.5 MG tablet   Follow Up Instructions: Will continue to assist patient in incorporating positive experiences into her life to promote a positive mental attitude.   Education given regarding appropriate lifestyle changes for weight loss, including regular physical activity, healthy coping strategies, caloric restriction, and healthy eating patterns.  The risks and benefits as well as side effects of medication, such as Phenteramine or Tenuate, is discussed.  The pros and cons of suppressing appetite and boosting metabolism is counseled.  Risks of tolerance and addiction discussed.  Use of medicine will be short term.  Pt to call with any negative side effects and agrees to keep follow up appointments.  Appt 2 mos for follow up   I discussed the assessment and treatment plan with the patient. The patient was provided an opportunity to ask questions and all were answered. The patient agreed with the plan and demonstrated an understanding of the instructions.   The patient was advised to call back or seek an in-person evaluation if the symptoms worsen or if the condition fails to improve as anticipated.  I provided 12 minutes of non-face-to-face time during this encounter.   Letitia Libra, MD

## 2021-01-03 ENCOUNTER — Encounter: Payer: Self-pay | Admitting: Obstetrics & Gynecology

## 2021-01-03 ENCOUNTER — Other Ambulatory Visit: Payer: Self-pay

## 2021-01-03 ENCOUNTER — Ambulatory Visit (INDEPENDENT_AMBULATORY_CARE_PROVIDER_SITE_OTHER): Payer: BC Managed Care – PPO | Admitting: Obstetrics & Gynecology

## 2021-01-03 VITALS — Ht 67.5 in | Wt 212.0 lb

## 2021-01-03 DIAGNOSIS — Z79899 Other long term (current) drug therapy: Secondary | ICD-10-CM | POA: Diagnosis not present

## 2021-01-03 DIAGNOSIS — E669 Obesity, unspecified: Secondary | ICD-10-CM | POA: Diagnosis not present

## 2021-01-03 DIAGNOSIS — Z6832 Body mass index (BMI) 32.0-32.9, adult: Secondary | ICD-10-CM

## 2021-01-03 MED ORDER — PHENTERMINE HCL 37.5 MG PO TABS: ORAL_TABLET | ORAL | 1 refills | Status: DC

## 2021-01-03 NOTE — Progress Notes (Signed)
Virtual Visit via Telephone Note  I connected with Bethany Wright on 01/03/21 at  8:40 AM EST by telephone and verified that I am speaking with the correct person using two identifiers.  Location: Patient: Home Provider: Office   I discussed the limitations, risks, security and privacy concerns of performing an evaluation and management service by telephone and the availability of in person appointments. I also discussed with the patient that there may be a patient responsible charge related to this service. The patient expressed understanding and agreed to proceed.  History of Present Illness:  Bethany Wright is a 30 y.o. who was started on  Current Outpatient Medications on File Prior to Visit  Medication Sig Dispense Refill  . phentermine (ADIPEX-P) 37.5 MG tablet One tablet po in morning. 30 tablet 1  approximately 3 months ago due to obesity/abnormal weight gain. The patient has lost 8 pounds over the past 2 mos due to meds and exercise boot camp..   She has these side effects: none.  PMHx: She  has no past medical history on file. Also,  has no past surgical history on file., family history includes Hypertension in her mother.,  reports that she has never smoked. She has never used smokeless tobacco. She reports that she does not drink alcohol and does not use drugs.  She has a current medication list which includes the following prescription(s): phentermine. Also, has No Known Allergies.  Review of Systems  All other systems reviewed and are negative.  Observations/Objective: No exam today, due to telephone eVisit due to [facility] virus restriction on elective visits and procedures.  Prior visits reviewed along with ultrasounds/labs as indicated. Reported Ht 5' 7.5" (1.715 m)   Wt 212 lb (96.2 kg)   LMP 12/06/2020   BMI 32.71 kg/m   Assessment and Plan:   ICD-10-CM   1. Obesity (BMI 30-39.9)  E66.9    Assessment: obesity Medication treatment is going well for  her.  Plan: Patient is continued/added to prescription appetite suppressants: Phentermine.   Will continue to assist patient in incorporating positive experiences into her life to promote a positive mental attitude.  Education given regarding appropriate lifestyle changes for weight loss, including regular physical activity, healthy coping strategies, caloric restriction, and healthy eating patterns.  The risks and benefits as well as side effects of medication, such as Phenteramine or Tenuate, is discussed.  The pros and cons of suppressing appetite and boosting metabolism is counseled.  Risks of tolerance and addiction discussed.  Use of medicine will be short term.  Pt to call with any negative side effects and agrees to keep follow up appointments.  Patient doing well with weight loss in progress. Will stop medicine after two more mos and allow for a drug-free holiday. Patient understands she may need future therapy if weight gain resumes or she does not reach her weight loss goals on her own with good diet and exercise habits.  Follow Up Instructions: Annual this summer May restart medicine then after a break, if continues w obesity or overweight challenges   I discussed the assessment and treatment plan with the patient. The patient was provided an opportunity to ask questions and all were answered. The patient agreed with the plan and demonstrated an understanding of the instructions.   The patient was advised to call back or seek an in-person evaluation if the symptoms worsen or if the condition fails to improve as anticipated.  I provided 15 minutes of non-face-to-face time during this encounter.  Hoyt Koch, MD

## 2021-06-09 ENCOUNTER — Ambulatory Visit: Payer: BC Managed Care – PPO | Admitting: Obstetrics & Gynecology

## 2021-07-11 ENCOUNTER — Encounter: Payer: Self-pay | Admitting: Obstetrics & Gynecology

## 2021-07-11 ENCOUNTER — Other Ambulatory Visit (HOSPITAL_COMMUNITY)
Admission: RE | Admit: 2021-07-11 | Discharge: 2021-07-11 | Disposition: A | Discharge status: Home / Self Care | Payer: BC Managed Care – PPO | Source: Ambulatory Visit | Attending: Obstetrics & Gynecology | Admitting: Obstetrics & Gynecology

## 2021-07-11 ENCOUNTER — Other Ambulatory Visit: Payer: Self-pay

## 2021-07-11 ENCOUNTER — Ambulatory Visit (INDEPENDENT_AMBULATORY_CARE_PROVIDER_SITE_OTHER): Payer: BC Managed Care – PPO | Admitting: Obstetrics & Gynecology

## 2021-07-11 VITALS — BP 120/80 | Ht 67.5 in | Wt 184.0 lb

## 2021-07-11 DIAGNOSIS — Z01419 Encounter for gynecological examination (general) (routine) without abnormal findings: Secondary | ICD-10-CM

## 2021-07-11 DIAGNOSIS — Z124 Encounter for screening for malignant neoplasm of cervix: Secondary | ICD-10-CM | POA: Diagnosis present

## 2021-07-11 NOTE — Patient Instructions (Signed)
Thank you for choosing [facility]. As part of our ongoing efforts to improve patient experience, we would appreciate your feedback. Please fill out the short survey that you will receive by mail or MyChart. Your opinion is important to us! -Dr Laiden Milles  

## 2021-07-11 NOTE — Progress Notes (Signed)
HPI:      [name] is a 30 y.o. G1P1001 who LMP was Patient's last menstrual period was 06/17/2021., she presents today for her annual examination. The patient has no complaints today. The patient is sexually active. Her last pap: approximate date 2021 and was normal. The patient does perform self breast exams.  There is no notable family history of breast or ovarian cancer in her family.  The patient has regular exercise: yes.  The patient denies current symptoms of depression.  Has lost weight and is content.  GYN History: Contraception: condoms  PMHx: History reviewed. No pertinent past medical history. History reviewed. No pertinent surgical history. Family History  Problem Relation Age of Onset   Hypertension Mother    Social History   Tobacco Use   Smoking status: Never   Smokeless tobacco: Never  Vaping Use   Vaping Use: Never used  Substance Use Topics   Alcohol use: No   Drug use: No    Current Outpatient Medications:    phentermine (ADIPEX-P) 37.5 MG tablet, One tablet po in morning. (Patient not taking: Reported on 07/11/2021), Disp: 30 tablet, Rfl: 1 Allergies: Patient has no known allergies.  Review of Systems  Constitutional:  Negative for chills, fever and malaise/fatigue.  HENT:  Negative for congestion, sinus pain and sore throat.   Eyes:  Negative for blurred vision and pain.  Respiratory:  Negative for cough and wheezing.   Cardiovascular:  Negative for chest pain and leg swelling.  Gastrointestinal:  Negative for abdominal pain, constipation, diarrhea, heartburn, nausea and vomiting.  Genitourinary:  Negative for dysuria, frequency, hematuria and urgency.  Musculoskeletal:  Negative for back pain, joint pain, myalgias and neck pain.  Skin:  Negative for itching and rash.  Neurological:  Negative for dizziness, tremors and weakness.  Endo/Heme/Allergies:  Does not bruise/bleed easily.  Psychiatric/Behavioral:  Negative for depression. The  patient is not nervous/anxious and does not have insomnia.    Objective: BP 120/80   Ht 5' 7.5" (1.715 m)   Wt 184 lb (83.5 kg)   LMP 06/17/2021   BMI 28.39 kg/m   Filed Weights   07/11/21 1428  Weight: 184 lb (83.5 kg)   Body mass index is 28.39 kg/m. Physical Exam Constitutional:      General: She is not in acute distress.    Appearance: She is well-developed.  Genitourinary:     Bladder, rectum and urethral meatus normal.     No lesions in the vagina.     Right Labia: No rash, tenderness or lesions.    Left Labia: No tenderness, lesions or rash.    No vaginal bleeding.      Right Adnexa: not tender and no mass present.    Left Adnexa: not tender and no mass present.    No cervical motion tenderness, friability, lesion or polyp.     Uterus is not enlarged.     No uterine mass detected.    Pelvic exam was performed with patient in the lithotomy position.  Breasts:    Right: No mass, skin change or tenderness.     Left: No mass, skin change or tenderness.  HENT:     Head: Normocephalic and atraumatic. No laceration.     Right Ear: Hearing normal.     Left Ear: Hearing normal.     Mouth/Throat:     Pharynx: Uvula midline.  Eyes:     Pupils: Pupils are equal, round, and reactive to light.  Neck:  Thyroid: No thyromegaly.  Cardiovascular:     Rate and Rhythm: Normal rate and regular rhythm.     Heart sounds: No murmur heard.   No friction rub. No gallop.  Pulmonary:     Effort: Pulmonary effort is normal. No respiratory distress.     Breath sounds: Normal breath sounds. No wheezing.  Abdominal:     General: Bowel sounds are normal. There is no distension.     Palpations: Abdomen is soft.     Tenderness: There is no abdominal tenderness. There is no rebound.  Musculoskeletal:        General: Normal range of motion.     Cervical back: Normal range of motion and neck supple.  Neurological:     Mental Status: She is alert and oriented to person, place, and time.      Cranial Nerves: No cranial nerve deficit.  Skin:    General: Skin is warm and dry.  Psychiatric:        Judgment: Judgment normal.  Vitals reviewed.    Assessment:  ANNUAL EXAM 1. Women's annual routine gynecological examination   2. Screening for cervical cancer      Screening Plan:            1.  Cervical Screening-  Pap smear done today  2. Breast screening- Exam annually and mammogram>40 planned   3. Colonoscopy every 10 years, Hemoccult testing - after age 76  4. Labs managed by PCP  5. Counseling for contraception: condoms  Upstream - 07/11/21 1429       Pregnancy Intention Screening   Does the patient want to become pregnant in the next year? No    Does the patient's partner want to become pregnant in the next year? No    Would the patient like to discuss contraceptive options today? No      Contraception Wrap Up   Current Method No Method - Other Reason    End Method No Method - Other Reason    Contraception Counseling Provided No            The pregnancy intention screening data noted above was reviewed. Potential methods of contraception were discussed. The patient elected to proceed with Female Condom.     F/U  Return in about 1 year (around 07/11/2022) for Annual.  Annamarie Major, MD, [facility], [facility] 07/11/2021  2:57 PM

## 2021-07-15 LAB — CYTOLOGY - PAP: Diagnosis: NEGATIVE

## 2021-07-17 ENCOUNTER — Telehealth: Payer: Self-pay

## 2021-07-17 NOTE — Telephone Encounter (Signed)
Pt calling; has question about her results from [place].  [phone]  Pt saw that pap smear said 'candida' and wanted to know if she needed to take anything for it.  Adv candida is yeast; sometimes we can have yeast and it doesn't bother [place]; it's when there is an overgrowth of it that bothers [place]; no need to take anything unless sxs.  Pt would feel better if she would tx it; adv monistat 3d or 7d.

## 2021-09-12 ENCOUNTER — Other Ambulatory Visit: Payer: Self-pay

## 2021-09-12 ENCOUNTER — Other Ambulatory Visit (HOSPITAL_COMMUNITY)
Admission: RE | Admit: 2021-09-12 | Discharge: 2021-09-12 | Disposition: A | Discharge status: Home / Self Care | Payer: BC Managed Care – PPO | Source: Ambulatory Visit | Attending: Obstetrics & Gynecology | Admitting: Obstetrics & Gynecology

## 2021-09-12 ENCOUNTER — Encounter: Payer: Self-pay | Admitting: Obstetrics & Gynecology

## 2021-09-12 ENCOUNTER — Ambulatory Visit (INDEPENDENT_AMBULATORY_CARE_PROVIDER_SITE_OTHER): Payer: BC Managed Care – PPO | Admitting: Obstetrics & Gynecology

## 2021-09-12 VITALS — BP 120/80 | Wt 188.0 lb

## 2021-09-12 DIAGNOSIS — Z369 Encounter for antenatal screening, unspecified: Secondary | ICD-10-CM

## 2021-09-12 DIAGNOSIS — Z3A08 8 weeks gestation of pregnancy: Secondary | ICD-10-CM

## 2021-09-12 DIAGNOSIS — N926 Irregular menstruation, unspecified: Secondary | ICD-10-CM

## 2021-09-12 DIAGNOSIS — Z3491 Encounter for supervision of normal pregnancy, unspecified, first trimester: Secondary | ICD-10-CM

## 2021-09-12 LAB — OB RESULTS CONSOLE VARICELLA ZOSTER ANTIBODY, IGG: Varicella: IMMUNE

## 2021-09-12 MED ORDER — ONDANSETRON 4 MG PO TBDP: 4.0000 mg | ORAL_TABLET | Freq: Four times a day (QID) | ORAL | 0 refills | Status: DC | PRN

## 2021-09-12 NOTE — Progress Notes (Signed)
09/12/2021   Chief Complaint: Missed period  History of Present Illness: Bethany Wright is a 30 y.o. G2P1001 [redacted]w[redacted]d based on Patient's last menstrual period was 07/12/2021. with an Estimated Date of Delivery: 04/18/22, with the above CC.   Her periods were: regular periods every 28 days    LMP 07/12/21, tool plan B but still conceived. She was using no method when she conceived.  She has Positive signs or symptoms of nausea/vomiting of pregnancy. She has Negative signs or symptoms of miscarriage or preterm labor She identifies Negative Zika risk factors for her and her partner On any different medications around the time she conceived/early pregnancy: No  History of varicella: Yes   ROS: A 12-point review of systems was performed and negative, except as stated in the above HPI.  OBGYN History: As per HPI. OB History  Gravida Para Term Preterm AB Living  2 1 1     1   SAB IAB Ectopic Multiple Live Births               # Outcome Date GA Lbr Len/2nd Weight Sex Delivery Anes PTL Lv  2 Current           1 Term      Vag-Spont       Any issues with any prior pregnancies: no.          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 Last pap smear 06/2021. Abnormal: no  History of STIs: No   Past Medical History: History reviewed. No pertinent past medical history.  Past Surgical History: History reviewed. No pertinent surgical history.  Family History:  Family History  Problem Relation Age of Onset   Hypertension Mother    She denies any female cancers, bleeding or blood clotting disorders.  She denies any history of mental retardation, birth defects or genetic disorders in her or the FOB's history  Social History:  Social History   Socioeconomic History   Marital status: Single    Spouse name: Not on file   Number of children: Not on file   Years of  education: Not on file   Highest education level: Not on file  Occupational History   Not on file  Tobacco Use   Smoking status: Never   Smokeless tobacco: Never  Vaping Use   Vaping Use: Never used  Substance and Sexual Activity   Alcohol use: No   Drug use: No   Sexual activity: Yes    Birth control/protection: None  Other Topics Concern   Not on file  Social History Narrative   Not on file   Social Determinants of Health   Financial Resource Strain: Not on file  Food Insecurity: Not on file  Transportation Needs: Not on file  Physical Activity: Not on file  Stress: Not on file  Social Connections: Not on file  Intimate Partner Violence: Not on file   Any pets in the household: no  Allergy: No Known Allergies  Current Outpatient Medications:  Current Outpatient Medications:    ondansetron (ZOFRAN ODT) 4 MG disintegrating tablet, Take 1 tablet (4 mg total) by mouth every 6 (six) hours as needed for nausea., Disp: 20 tablet, Rfl: 0   Physical Exam:   BP 120/80   Wt 188 lb (85.3 kg)   LMP 07/12/2021   BMI 29.01 kg/m  Body mass index is  29.01 kg/m. Constitutional: Well nourished, well developed female in no acute distress.  Neck:  Supple, normal appearance, and no thyromegaly  Cardiovascular: S1, S2 normal, no murmur, rub or gallop, regular rate and rhythm Respiratory:  Clear to auscultation bilateral. Normal respiratory effort Abdomen: positive bowel sounds and no masses, hernias; diffusely non tender to palpation, non distended Breasts: breasts appear normal, no suspicious masses, no skin or nipple changes or axillary nodes. Neuro/Psych:  Normal mood and affect.  Skin:  Warm and dry.  Lymphatic:  No inguinal lymphadenopathy.   Pelvic exam: is not limited by body habitus EGBUS: within normal limits, Vagina: within normal limits and with no blood in the vault, Cervix: normal appearing cervix without discharge or lesions, closed/long/high, Uterus:  enlarged: 8  weeks, retroverted uterus, and Adnexa:  normal adnexa  Assessment: Bethany Wright is a 30 y.o. G2P1001 [redacted]w[redacted]d based on Patient's last menstrual period was 07/12/2021. with an Estimated Date of Delivery: 04/18/22,  for prenatal care.    ICD-10-CM   1. Missed period  N92.6 US OB LESS THAN 14 WEEKS WITH OB TRANSVAGINAL    2. Encounter for supervision of low-risk pregnancy in first trimester  Z34.91     3. Prenatal screening encounter  Z36.9 Pregnancy, Initial Screen    Varicella zoster antibody, IgG    GC/Chlamydia probe amp ([place])not at [facility]    Hgb Fractionation Cascade    4. [redacted] weeks gestation of pregnancy  Z3A.08      Plan:  1) Avoid alcoholic beverages. 2) Patient encouraged not to smoke.  3) Discontinue the use of all non-medicinal drugs and chemicals.  4) Take prenatal vitamins daily.  5) Seatbelt use advised 6) Nutrition, food safety (fish, cheese advisories, and high nitrite foods) and exercise discussed. 7) Hospital and practice style delivering at [facility] discussed  8) Patient is asked about travel to areas at risk for the Zika virus, and counseled to avoid travel and exposure to mosquitoes or sexual partners who may have themselves been exposed to the virus. Testing is discussed, and will be ordered as appropriate.  9) Childbirth classes at [facility] advised 10) Genetic Screening, such as with 1st Trimester Screening, cell free fetal DNA, AFP testing, and Ultrasound, as well as with amniocentesis and CVS as appropriate, is discussed with patient. She plans to have genetic testing this pregnancy. 11) Sch [place] for dating as available.  Risks of twins (FH) discussed. 12) Zofran for nausea  Problem list reviewed and updated.  Annamarie Major, MD, [facility], [facility] 09/12/2021  8:59 AM

## 2021-09-12 NOTE — Patient Instructions (Signed)
First Trimester of Pregnancy °The first trimester of pregnancy starts on the first day of your last menstrual period until the end of week 12. This is months 1 through 3 of pregnancy. A week after a sperm fertilizes an egg, the egg will implant into the wall of the uterus and begin to develop into a baby. By the end of 12 weeks, all the baby's organs will be formed and the baby will be 2-3 inches in size. °Body changes during your first trimester °Your body goes through many changes during pregnancy. The changes vary and generally return to normal after your baby is born. °Physical changes °You may gain or lose weight. °Your breasts may begin to grow larger and become tender. The tissue that surrounds your nipples (areola) may become darker. °Dark spots or blotches (chloasma or mask of pregnancy) may develop on your face. °You may have changes in your hair. These can include thickening or thinning of your hair or changes in texture. °Health changes °You may feel nauseous, and you may vomit. °You may have heartburn. °You may develop headaches. °You may develop constipation. °Your gums may bleed and may be sensitive to brushing and flossing. °Other changes °You may tire easily. °You may urinate more often. °Your menstrual periods will stop. °You may have a loss of appetite. °You may develop cravings for certain kinds of food. °You may have changes in your emotions from day to day. °You may have more vivid and strange dreams. °Follow these instructions at home: °Medicines °Follow your health care provider's instructions regarding medicine use. Specific medicines may be either safe or unsafe to take during pregnancy. Do not take any medicines unless told to by your health care provider. °Take a prenatal vitamin that contains at least 600 micrograms (mcg) of folic acid. °Eating and drinking °Eat a healthy diet that includes fresh fruits and vegetables, whole grains, good sources of protein such as meat, eggs, or tofu,  and low-fat dairy products. °Avoid raw meat and unpasteurized juice, milk, and cheese. These carry germs that can harm you and your baby. °If you feel nauseous or you vomit: °Eat 4 or 5 small meals a day instead of 3 large meals. °Try eating a few soda crackers. °Drink liquids between meals instead of during meals. °You may need to take these actions to prevent or treat constipation: °Drink enough fluid to keep your urine pale yellow. °Eat foods that are high in fiber, such as beans, whole grains, and fresh fruits and vegetables. °Limit foods that are high in fat and processed sugars, such as fried or sweet foods. °Activity °Exercise only as directed by your health care provider. Most people can continue their usual exercise routine during pregnancy. Try to exercise for 30 minutes at least 5 days a week. °Stop exercising if you develop pain or cramping in the lower abdomen or lower back. °Avoid exercising if it is very hot or humid or if you are at high altitude. °Avoid heavy lifting. °If you choose to, you may have sex unless your health care provider tells you not to. °Relieving pain and discomfort °Wear a good support bra to relieve breast tenderness. °Rest with your legs elevated if you have leg cramps or low back pain. °If you develop bulging veins (varicose veins) in your legs: °Wear support hose as told by your health care provider. °Elevate your feet for 15 minutes, 3-4 times a day. °Limit salt in your diet. °Safety °Wear your seat belt at all times when driving   or riding in a car. °Talk with your health care provider if someone is verbally or physically abusive to you. °Talk with your health care provider if you are feeling sad or have thoughts of hurting yourself. °Lifestyle °Do not use hot tubs, steam rooms, or saunas. °Do not douche. Do not use tampons or scented sanitary pads. °Do not use herbal remedies, alcohol, illegal drugs, or medicines that are not approved by your health care provider. Chemicals  in these products can harm your baby. °Do not use any products that contain nicotine or tobacco, such as cigarettes, e-cigarettes, and chewing tobacco. If you need help quitting, ask your health care provider. °Avoid cat litter boxes and soil used by cats. These carry germs that can cause birth defects in the baby and possibly loss of the unborn baby (fetus) by miscarriage or stillbirth. °General instructions °During routine prenatal visits in the first trimester, your health care provider will do a physical exam, perform necessary tests, and ask you how things are going. Keep all follow-up visits. This is important. °Ask for help if you have counseling or nutritional needs during pregnancy. Your health care provider can offer advice or refer you to specialists for help with various needs. °Schedule a dentist appointment. At home, brush your teeth with a soft toothbrush. Floss gently. °Write down your questions. Take them to your prenatal visits. °Where to find more information °American Pregnancy Association: americanpregnancy.org °American College of Obstetricians and Gynecologists: acog.org/en/Womens%20Health/Pregnancy °Office on Women's Health: womenshealth.gov/pregnancy °Contact a health care provider if you have: °Dizziness. °A fever. °Mild pelvic cramps, pelvic pressure, or nagging pain in the abdominal area. °Nausea, vomiting, or diarrhea that lasts for 24 hours or longer. °A bad-smelling vaginal discharge. °Pain when you urinate. °Known exposure to a contagious illness, such as chickenpox, measles, Zika virus, HIV, or hepatitis. °Get help right away if you have: °Spotting or bleeding from your vagina. °Severe abdominal cramping or pain. °Shortness of breath or chest pain. °Any kind of trauma, such as from a fall or a car crash. °New or increased pain, swelling, or redness in an arm or leg. °Summary °The first trimester of pregnancy starts on the first day of your last menstrual period until the end of week  12 (months 1 through 3). °Eating 4 or 5 small meals a day rather than 3 large meals may help to relieve nausea and vomiting. °Do not use any products that contain nicotine or tobacco, such as cigarettes, e-cigarettes, and chewing tobacco. If you need help quitting, ask your health care provider. °Keep all follow-up visits. This is important. °This information is not intended to replace advice given to you by your health care provider. Make sure you discuss any questions you have with your health care provider. °Document Revised: 03/20/2020 Document Reviewed: 01/25/2020 °Elsevier Patient Education © 2022 [employer] ° °

## 2021-09-15 LAB — GC/CHLAMYDIA PROBE AMP (~~LOC~~) NOT AT ARMC
Chlamydia: NEGATIVE
Comment: NEGATIVE
Comment: NORMAL
Neisseria Gonorrhea: NEGATIVE

## 2021-09-16 ENCOUNTER — Other Ambulatory Visit: Payer: Self-pay | Admitting: Obstetrics & Gynecology

## 2021-09-16 LAB — PREGNANCY, INITIAL SCREEN
Antibody Screen: NEGATIVE
Basophils Absolute: 0.1 10*3/uL (ref 0.0–0.2)
Basos: 1 %
Bilirubin, UA: NEGATIVE
Chlamydia trachomatis, NAA: NEGATIVE
EOS (ABSOLUTE): 0.1 10*3/uL (ref 0.0–0.4)
Eos: 1 %
Glucose, UA: NEGATIVE
HCV Ab: 0.2 s/co ratio (ref 0.0–0.9)
HIV Screen 4th Generation wRfx: NONREACTIVE
Hematocrit: 41.5 % (ref 34.0–46.6)
Hemoglobin: 14.2 g/dL (ref 11.1–15.9)
Hepatitis B Surface Ag: NEGATIVE
Immature Grans (Abs): 0 10*3/uL (ref 0.0–0.1)
Immature Granulocytes: 0 %
Ketones, UA: NEGATIVE
Leukocytes,UA: NEGATIVE
Lymphocytes Absolute: 1.6 10*3/uL (ref 0.7–3.1)
Lymphs: 18 %
MCH: 30.5 pg (ref 26.6–33.0)
MCHC: 34.2 g/dL (ref 31.5–35.7)
MCV: 89 fL (ref 79–97)
Monocytes Absolute: 0.5 10*3/uL (ref 0.1–0.9)
Monocytes: 5 %
Neisseria Gonorrhoeae by PCR: NEGATIVE
Neutrophils Absolute: 7 10*3/uL (ref 1.4–7.0)
Neutrophils: 75 %
Nitrite, UA: NEGATIVE
Platelets: 349 10*3/uL (ref 150–450)
RBC, UA: NEGATIVE
RBC: 4.66 x10E6/uL (ref 3.77–5.28)
RDW: 12.8 % (ref 11.7–15.4)
RPR Ser Ql: NONREACTIVE
Rh Factor: POSITIVE
Rubella Antibodies, IGG: 1.48 index (ref >0.99)
Specific Gravity, UA: 1.029 (ref 1.005–1.030)
Urobilinogen, Ur: 1 mg/dL (ref 0.2–1.0)
WBC: 9.3 10*3/uL (ref 3.4–10.8)
pH, UA: 7.5 (ref 5.0–7.5)

## 2021-09-16 LAB — MICROSCOPIC EXAMINATION
Bacteria, UA: NONE SEEN
Casts: NONE SEEN /lpf
WBC, UA: NONE SEEN /hpf (ref 0–5)

## 2021-09-16 LAB — URINE CULTURE, OB REFLEX

## 2021-09-16 LAB — HGB FRACTIONATION CASCADE
Hgb A2: 2.9 % (ref 1.8–3.2)
Hgb A: 97.1 % (ref 96.4–98.8)
Hgb F: 0 % (ref 0.0–2.0)
Hgb S: 0 %

## 2021-09-16 LAB — HCV INTERPRETATION

## 2021-09-16 LAB — VARICELLA ZOSTER ANTIBODY, IGG: Varicella zoster IgG: 210 index (ref >165)

## 2021-09-16 MED ORDER — CEPHALEXIN 500 MG PO CAPS: 500.0000 mg | ORAL_CAPSULE | Freq: Four times a day (QID) | ORAL | 2 refills | Status: DC

## 2021-09-23 ENCOUNTER — Other Ambulatory Visit: Payer: Self-pay

## 2021-09-23 ENCOUNTER — Encounter: Payer: Self-pay | Admitting: Obstetrics & Gynecology

## 2021-09-23 ENCOUNTER — Ambulatory Visit
Admission: RE | Admit: 2021-09-23 | Discharge: 2021-09-23 | Disposition: A | Discharge status: Home / Self Care | Payer: BC Managed Care – PPO | Source: Ambulatory Visit | Attending: Obstetrics & Gynecology | Admitting: Obstetrics & Gynecology

## 2021-09-23 DIAGNOSIS — N926 Irregular menstruation, unspecified: Secondary | ICD-10-CM | POA: Diagnosis present

## 2021-09-24 ENCOUNTER — Other Ambulatory Visit: Payer: Self-pay | Admitting: Obstetrics & Gynecology

## 2021-09-24 DIAGNOSIS — O468X1 Other antepartum hemorrhage, first trimester: Secondary | ICD-10-CM

## 2021-09-24 DIAGNOSIS — O418X10 Other specified disorders of amniotic fluid and membranes, first trimester, not applicable or unspecified: Secondary | ICD-10-CM

## 2021-09-24 NOTE — Progress Notes (Signed)
Pt denies pain or bleeding [place] reviewed w pt Discussed SCH (small) Will monitor for sx's, and repeat US for assessment in 3 weeks    ICD-10-CM   1. Subchorionic hemorrhage of placenta in first trimester, single or unspecified fetus  O41.8X10 US OB Follow Up   O46.8X1     Annamarie Major, MD, [facility], [facility] 09/24/2021  11:11 AM

## 2021-09-25 DIAGNOSIS — Z419 Encounter for procedure for purposes other than remedying health state, unspecified: Secondary | ICD-10-CM | POA: Diagnosis not present

## 2021-09-26 ENCOUNTER — Other Ambulatory Visit: Payer: Self-pay

## 2021-09-26 ENCOUNTER — Ambulatory Visit (INDEPENDENT_AMBULATORY_CARE_PROVIDER_SITE_OTHER): Payer: BC Managed Care – PPO | Admitting: Obstetrics

## 2021-09-26 ENCOUNTER — Encounter: Payer: BC Managed Care – PPO | Admitting: Licensed Practical Nurse

## 2021-09-26 VITALS — BP 90/60 | Wt 193.0 lb

## 2021-09-26 DIAGNOSIS — Z3491 Encounter for supervision of normal pregnancy, unspecified, first trimester: Secondary | ICD-10-CM

## 2021-09-26 DIAGNOSIS — Z1379 Encounter for other screening for genetic and chromosomal anomalies: Secondary | ICD-10-CM

## 2021-09-26 DIAGNOSIS — Z3A10 10 weeks gestation of pregnancy: Secondary | ICD-10-CM

## 2021-09-26 DIAGNOSIS — O468X1 Other antepartum hemorrhage, first trimester: Secondary | ICD-10-CM

## 2021-09-26 DIAGNOSIS — O418X10 Other specified disorders of amniotic fluid and membranes, first trimester, not applicable or unspecified: Secondary | ICD-10-CM

## 2021-09-26 NOTE — Progress Notes (Signed)
  Routine Prenatal Care Visit  Subjective  Bethany Wright is a 30 y.o. G2P1001 at [redacted]w[redacted]d being seen today for ongoing prenatal care.  She is currently monitored for the following issues for this low-risk pregnancy and has Obesity (BMI 35.0-39.9 without comorbidity) and Encounter for supervision of low-risk pregnancy in first trimester on their problem list.  ----------------------------------------------------------------------------------- Patient reports no complaints.  She was told that she would have a f/u scan afetr her last revealed a small subchorionic hem.  .  .   Pincus Large Fluid denies.  ----------------------------------------------------------------------------------- The following portions of the patient's history were reviewed and updated as appropriate: allergies, current medications, past family history, past medical history, past social history, past surgical history and problem list. Problem list updated.  Objective  Blood pressure 90/60, weight 193 lb (87.5 kg), last menstrual period 07/12/2021. Pregravid weight 184 lb (83.5 kg) Total Weight Gain 9 lb (4.082 kg) Urinalysis: Urine Protein    Urine Glucose    Fetal Status:           General:  Alert, oriented and cooperative. Patient is in no acute distress.  Skin: Skin is warm and dry. No rash noted.   Cardiovascular: Normal heart rate noted  Respiratory: Normal respiratory effort, no problems with respiration noted  Abdomen: Soft, gravid, appropriate for gestational age.       Pelvic:  Cervical exam deferred        Extremities: Normal range of motion.  Edema: None  Mental Status: Normal mood and affect. Normal behavior. Normal judgment and thought content.   Assessment   30 y.o. G2P1001 at [redacted]w[redacted]d by  04/18/2022, by Last Menstrual Period presenting for routine prenatal visit  Plan   pregnancy2 Problems (from 09/12/21 to present)    Problem Noted Resolved   Encounter for supervision of low-risk pregnancy in first  trimester 09/12/2021 by Nadara Mustard, MD No   Overview Addendum 09/26/2021 11:53 AM by Mirna Mires, CNM     Nursing Staff Provider  Office Location  [facility] Dating  LMP  Language  English Anatomy US    Flu Vaccine   Genetic Screen  NIPS: desires  TDaP vaccine    Hgb A1C or  GTT Third trimester :   Covid    LAB RESULTS   Rhogam   Blood Type A/Positive/-- (11/18 0902)   Feeding Plan  Antibody Negative (11/18 0902)  Contraception  Rubella 1.48 (11/18 0902)  Circumcision  RPR Non Reactive (11/18 0902)   Pediatrician   HBsAg Negative (11/18 0902)   Support Person  HIV Non Reactive (11/18 0902)  Prenatal Classes  Varicella     GBS  (For PCN allergy, check sensitivities)   BTL Consent     VBAC Consent n/a Pap  9/22 nml    Hgb Electro    Pelvis Tested yes                           Preterm labor symptoms and general obstetric precautions including but not limited to vaginal bleeding, contractions, leaking of fluid and fetal movement were reviewed in detail with the patient. Please refer to After Visit Summary for other counseling recommendations.  No FHTS hear yet abdominally. She has a retroverted uterus per her report. Repeat scan ordered today to recheck the subchorionic hem.  Return in about 4 weeks (around 10/24/2021) for return OB.  Mirna Mires, CNM  09/26/2021 12:15 PM

## 2021-09-26 NOTE — Progress Notes (Signed)
ROB- no concerns 

## 2021-10-02 LAB — MATERNIT 21 PLUS CORE, BLOOD
Fetal Fraction: 5
Result (T21): NEGATIVE
Trisomy 13 (Patau syndrome): NEGATIVE
Trisomy 18 (Edwards syndrome): NEGATIVE
Trisomy 21 (Down syndrome): NEGATIVE

## 2021-10-09 LAB — INHERITEST CORE(CF97,SMA,FRAX)

## 2021-10-13 ENCOUNTER — Ambulatory Visit
Admission: RE | Admit: 2021-10-13 | Discharge: 2021-10-13 | Disposition: A | Discharge status: Home / Self Care | Payer: BC Managed Care – PPO | Source: Ambulatory Visit | Attending: Obstetrics | Admitting: Obstetrics

## 2021-10-13 ENCOUNTER — Other Ambulatory Visit: Payer: Self-pay

## 2021-10-13 DIAGNOSIS — Z3A13 13 weeks gestation of pregnancy: Secondary | ICD-10-CM | POA: Diagnosis not present

## 2021-10-13 DIAGNOSIS — O418X10 Other specified disorders of amniotic fluid and membranes, first trimester, not applicable or unspecified: Secondary | ICD-10-CM | POA: Diagnosis not present

## 2021-10-13 DIAGNOSIS — Z3491 Encounter for supervision of normal pregnancy, unspecified, first trimester: Secondary | ICD-10-CM

## 2021-10-13 DIAGNOSIS — O468X1 Other antepartum hemorrhage, first trimester: Secondary | ICD-10-CM | POA: Insufficient documentation

## 2021-10-21 ENCOUNTER — Other Ambulatory Visit: Payer: Self-pay

## 2021-10-21 ENCOUNTER — Ambulatory Visit (INDEPENDENT_AMBULATORY_CARE_PROVIDER_SITE_OTHER): Payer: BC Managed Care – PPO | Admitting: Obstetrics

## 2021-10-21 VITALS — BP 110/74 | Wt 201.0 lb

## 2021-10-21 DIAGNOSIS — Z3491 Encounter for supervision of normal pregnancy, unspecified, first trimester: Secondary | ICD-10-CM

## 2021-10-21 DIAGNOSIS — Z3A14 14 weeks gestation of pregnancy: Secondary | ICD-10-CM

## 2021-10-21 DIAGNOSIS — Z3480 Encounter for supervision of other normal pregnancy, unspecified trimester: Secondary | ICD-10-CM

## 2021-10-21 DIAGNOSIS — G43001 Migraine without aura, not intractable, with status migrainosus: Secondary | ICD-10-CM

## 2021-10-21 MED ORDER — BUTALBITAL-APAP-CAFFEINE 50-325-40 MG PO CAPS
1.0000 | ORAL_CAPSULE | Freq: Four times a day (QID) | ORAL | 0 refills | Status: DC | PRN
Start: 2021-10-21 — End: 2022-03-18

## 2021-10-21 NOTE — Progress Notes (Signed)
Routine Prenatal Care Visit  Subjective  Bethany Wright is a 30 y.o. G2P1001 at [redacted]w[redacted]d being seen today for ongoing prenatal care.  She is currently monitored for the following issues for this low-risk pregnancy and has Obesity (BMI 35.0-39.9 without comorbidity) and Encounter for supervision of low-risk pregnancy in first trimester on their problem list.  ----------------------------------------------------------------------------------- Patient reports headache.  She has a hx of untreated migraines- has photophobia with headaches- has not tried sequential tylenol or caffeine in the past. Never treated by PCP. Contractions: Not present. Vag. Bleeding: None.   . Leaking Fluid denies.  ----------------------------------------------------------------------------------- The following portions of the patient's history were reviewed and updated as appropriate: allergies, current medications, past family history, past medical history, past social history, past surgical history and problem list. Problem list updated.  Objective  Blood pressure 110/74, weight 201 lb (91.2 kg), last menstrual period 07/12/2021. Pregravid weight 184 lb (83.5 kg) Total Weight Gain 17 lb (7.711 kg) Urinalysis: Urine Protein    Urine Glucose    Fetal Status:           General:  Alert, oriented and cooperative. Patient is in no acute distress.  Skin: Skin is warm and dry. No rash noted.   Cardiovascular: Normal heart rate noted  Respiratory: Normal respiratory effort, no problems with respiration noted  Abdomen: Soft, gravid, appropriate for gestational age. Pain/Pressure: Absent     Pelvic:  Cervical exam deferred        Extremities: Normal range of motion.     Mental Status: Normal mood and affect. Normal behavior. Normal judgment and thought content.   Assessment   30 y.o. G2P1001 at [redacted]w[redacted]d by  04/18/2022, by Last Menstrual Period presenting for routine prenatal visit  Plan   pregnancy2 Problems (from 09/12/21  to present)    Problem Noted Resolved   Encounter for supervision of low-risk pregnancy in first trimester 09/12/2021 by Nadara Mustard, MD No   Overview Addendum 10/03/2021  3:18 PM by Mirna Mires, CNM     Nursing Staff Provider  Office Location  [facility] Dating  LMP  Language  English Anatomy US    Flu Vaccine   Genetic Screen  NIPS: desires, neg female  TDaP vaccine    Hgb A1C or  GTT Third trimester :   Covid    LAB RESULTS   Rhogam   Blood Type A/Positive/-- (11/18 0902)   Feeding Plan  Antibody Negative (11/18 0902)  Contraception  Rubella 1.48 (11/18 0902)  Circumcision  RPR Non Reactive (11/18 0902)   Pediatrician   HBsAg Negative (11/18 0902)   Support Person  HIV Non Reactive (11/18 0902)  Prenatal Classes  Varicella     GBS  (For PCN allergy, check sensitivities)   BTL Consent     VBAC Consent n/a Pap  9/22 nml    Hgb Electro    Pelvis Tested yes                           Preterm labor symptoms and general obstetric precautions including but not limited to vaginal bleeding, contractions, leaking of fluid and fetal movement were reviewed in detail with the patient. Please refer to After Visit Summary for other counseling recommendations.  We discussed her trying Tylenol 1000 mg q 4-6 hours for several doses, and will also Rx hr limited Fioricet. Caffeine suggested.  She will have her anatomy scan in 4 weeks at [facility].  Return in about 4  weeks (around 11/18/2021) for return OB.  Mirna Mires, CNM  10/21/2021 8:20 AM

## 2021-10-21 NOTE — Progress Notes (Signed)
No vb. No lof. Pt getting a lot of migraines.

## 2021-10-23 ENCOUNTER — Emergency Department: Payer: BC Managed Care – PPO

## 2021-10-23 ENCOUNTER — Telehealth: Payer: Self-pay

## 2021-10-23 ENCOUNTER — Other Ambulatory Visit: Payer: Self-pay

## 2021-10-23 ENCOUNTER — Emergency Department
Admission: EM | Admit: 2021-10-23 | Discharge: 2021-10-23 | Disposition: A | Discharge status: Home / Self Care | Payer: BC Managed Care – PPO | Attending: Emergency Medicine | Admitting: Emergency Medicine

## 2021-10-23 DIAGNOSIS — Z3A15 15 weeks gestation of pregnancy: Secondary | ICD-10-CM | POA: Insufficient documentation

## 2021-10-23 DIAGNOSIS — N390 Urinary tract infection, site not specified: Secondary | ICD-10-CM

## 2021-10-23 DIAGNOSIS — O2342 Unspecified infection of urinary tract in pregnancy, second trimester: Secondary | ICD-10-CM | POA: Diagnosis present

## 2021-10-23 DIAGNOSIS — R52 Pain, unspecified: Secondary | ICD-10-CM

## 2021-10-23 DIAGNOSIS — N9489 Other specified conditions associated with female genital organs and menstrual cycle: Secondary | ICD-10-CM | POA: Insufficient documentation

## 2021-10-23 DIAGNOSIS — R1031 Right lower quadrant pain: Secondary | ICD-10-CM

## 2021-10-23 LAB — URINALYSIS, ROUTINE W REFLEX MICROSCOPIC
Bilirubin Urine: NEGATIVE
Glucose, UA: NEGATIVE mg/dL
Hgb urine dipstick: NEGATIVE
Ketones, ur: NEGATIVE mg/dL
Nitrite: NEGATIVE
Protein, ur: NEGATIVE mg/dL
Specific Gravity, Urine: 1.019 (ref 1.005–1.030)
pH: 6 (ref 5.0–8.0)

## 2021-10-23 LAB — CBC
HCT: 39.2 % (ref 36.0–46.0)
Hemoglobin: 13 g/dL (ref 12.0–15.0)
MCH: 29.9 pg (ref 26.0–34.0)
MCHC: 33.2 g/dL (ref 30.0–36.0)
MCV: 90.1 fL (ref 80.0–100.0)
Platelets: 302 10*3/uL (ref 150–400)
RBC: 4.35 MIL/uL (ref 3.87–5.11)
RDW: 12.9 % (ref 11.5–15.5)
WBC: 10.7 10*3/uL — ABNORMAL HIGH (ref 4.0–10.5)
nRBC: 0 % (ref 0.0–0.2)

## 2021-10-23 LAB — HCG, QUANTITATIVE, PREGNANCY: hCG, Beta Chain, Quant, S: 30213 m[IU]/mL — ABNORMAL HIGH

## 2021-10-23 LAB — COMPREHENSIVE METABOLIC PANEL WITH GFR
ALT: 20 U/L (ref 0–44)
AST: 14 U/L — ABNORMAL LOW (ref 15–41)
Albumin: 3.4 g/dL — ABNORMAL LOW (ref 3.5–5.0)
Alkaline Phosphatase: 39 U/L (ref 38–126)
Anion gap: 5 (ref 5–15)
BUN: 10 mg/dL (ref 6–20)
CO2: 23 mmol/L (ref 22–32)
Calcium: 8.6 mg/dL — ABNORMAL LOW (ref 8.9–10.3)
Chloride: 106 mmol/L (ref 98–111)
Creatinine, Ser: 0.61 mg/dL (ref 0.44–1.00)
GFR, Estimated: >60 mL/min
Glucose, Bld: 80 mg/dL (ref 70–99)
Potassium: 3.9 mmol/L (ref 3.5–5.1)
Sodium: 134 mmol/L — ABNORMAL LOW (ref 135–145)
Total Bilirubin: 0.4 mg/dL (ref 0.3–1.2)
Total Protein: 6.9 g/dL (ref 6.5–8.1)

## 2021-10-23 LAB — LIPASE, BLOOD: Lipase: 32 U/L (ref 11–51)

## 2021-10-23 MED ORDER — CEPHALEXIN 500 MG PO CAPS: 500.0000 mg | ORAL_CAPSULE | Freq: Three times a day (TID) | ORAL | 0 refills | Status: AC

## 2021-10-23 NOTE — Telephone Encounter (Signed)
Pt calling; 15wks; experiencing sharpe pain right side above pubic bone; nl or go to ED.  [phone] Pt currently at [facility] ED.

## 2021-10-23 NOTE — ED Triage Notes (Signed)
Pt comes into the ED via POV c/o RLQ abd pain that started this morning.  Pt is currently [redacted] weeks pregnant.  Pt denies any vaginal bleeding.  Pt denies any N/V/D.  Pt does admit to a headache as well.  Pt describes the pain as a sharp pain.

## 2021-10-23 NOTE — ED Provider Notes (Signed)
[facility] Emergency Department Provider Note    ____________________________________________   Event Date/Time   First MD Initiated Contact with Patient 10/23/21 1543     (approximate)  I have reviewed the triage vital signs and the nursing notes.   HISTORY  Chief Complaint Abdominal Pain   HPI Bethany Wright is a 30 y.o. female, no remarkable medical history, [redacted] weeks pregnant, presents the emergency department for evaluation of abdominal pain.  Patient reports a sudden onset of right lower quadrant pain began earlier this morning.  Reported 9/10 pain, sharp, constant, at first.  It is still present now but is 6/10.  Denies fever/chills, vaginal bleeding, nausea/vomiting, back pain, or urinary symptoms.  History limited by: No limitations.  No past medical history on file.  Patient Active Problem List   Diagnosis Date Noted   Encounter for supervision of low-risk pregnancy in first trimester 09/12/2021   Obesity (BMI 35.0-39.9 without comorbidity) 10/07/2020    No past surgical history on file.  Prior to Admission medications   Medication Sig Start Date End Date Taking? Authorizing Provider  cephALEXin (KEFLEX) 500 MG capsule Take 1 capsule (500 mg total) by mouth 3 (three) times daily for 7 days. 10/23/21 10/30/21 Yes Varney DailySimpson, Jacquez Sheetz Lee, PA  Butalbital-APAP-Caffeine [id number] MG capsule Take 1-2 capsules by mouth every 6 (six) hours as needed for headache. 10/21/21   Mirna MiresFryer, Margaret M, CNM    Allergies Patient has no known allergies.  Family History  Problem Relation Age of Onset   Hypertension Mother     Social History Social History   Tobacco Use   Smoking status: Never   Smokeless tobacco: Never  Vaping Use   Vaping Use: Never used  Substance Use Topics   Alcohol use: No   Drug use: No    Review of Systems Review of Systems  Constitutional:  Negative for chills and fever.  HENT:  Negative for ear pain and sore throat.    Eyes:  Negative for blurred vision.  Respiratory:  Negative for sputum production and shortness of breath.   Cardiovascular:  Negative for chest pain and leg swelling.  Gastrointestinal:  Positive for abdominal pain. Negative for vomiting.  Genitourinary:  Negative for dysuria, flank pain and hematuria.  Musculoskeletal:  Negative for myalgias.  Skin:  Negative for rash.  Neurological:  Negative for headaches.    10-point ROS otherwise negative. ____________________________________________   PHYSICAL EXAM:  VITAL SIGNS: ED Triage Vitals  Enc Vitals Group     BP 10/23/21 1504 112/72     Pulse Rate 10/23/21 1504 68     Resp 10/23/21 1504 17     Temp 10/23/21 1504 97.7 F (36.5 C)     Temp Source 10/23/21 1504 Oral     SpO2 10/23/21 1504 100 %     Weight 10/23/21 1455 201 lb (91.2 kg)     Height 10/23/21 1455 5' 7.5" (1.715 m)     Head Circumference --      Peak Flow --      Pain Score 10/23/21 1455 7     Pain Loc --      Pain Edu? --      Excl. in GC? --     Physical Exam Constitutional:      General: She is not in acute distress.    Appearance: Normal appearance. She is not ill-appearing.  HENT:     Head: Normocephalic.     Nose: Nose normal.     Mouth/Throat:  Mouth: Mucous membranes are moist.     Pharynx: Oropharynx is clear.  Eyes:     Conjunctiva/sclera: Conjunctivae normal.     Pupils: Pupils are equal, round, and reactive to light.  Cardiovascular:     Rate and Rhythm: Normal rate and regular rhythm.  Pulmonary:     Effort: Pulmonary effort is normal. No respiratory distress.     Breath sounds: Normal breath sounds. No wheezing, rhonchi or rales.  Abdominal:     General: Abdomen is flat. There is no distension.     Palpations: Abdomen is soft.     Tenderness: There is abdominal tenderness.     Comments: Notable tenderness when palpating the right lower quadrant.  Otherwise nondistended, no masses, no hernias.  No CVA tenderness.  Musculoskeletal:         General: Normal range of motion.     Cervical back: Normal range of motion and neck supple.  Skin:    General: Skin is warm and dry.  Neurological:     General: No focal deficit present.     Mental Status: She is alert. Mental status is at baseline.  Psychiatric:        Mood and Affect: Mood normal.        Behavior: Behavior normal.        Thought Content: Thought content normal.        Judgment: Judgment normal.     ____________________________________________    LABS  (all labs ordered are listed, but only abnormal results are displayed)  Labs Reviewed  COMPREHENSIVE METABOLIC PANEL - Abnormal; Notable for the following components:      Result Value   Sodium 134 (*)    Calcium 8.6 (*)    Albumin 3.4 (*)    AST 14 (*)    All other components within normal limits  CBC - Abnormal; Notable for the following components:   WBC 10.7 (*)    All other components within normal limits  URINALYSIS, ROUTINE W REFLEX MICROSCOPIC - Abnormal; Notable for the following components:   Color, Urine YELLOW (*)    APPearance HAZY (*)    Leukocytes,Ua SMALL (*)    Bacteria, UA RARE (*)    All other components within normal limits  HCG, QUANTITATIVE, PREGNANCY - Abnormal; Notable for the following components:   hCG, Beta Chain, Quant, S 30,213 (*)    All other components within normal limits  LIPASE, BLOOD     ____________________________________________   EKG Not applicable.   ____________________________________________    RADIOLOGY I personally viewed and evaluated these images as part of my medical decision making, as well as reviewing the written report by the radiologist.  ED Provider Interpretation: I agree with the interpretation of the radiologist.  Normal appendix.  MR PELVIS WO CONTRAST  Result Date: 10/23/2021 CLINICAL DATA:  Right lower quadrant abdominal pain cough concern for appendicitis EXAM: MRI ABDOMEN AND PELVIS WITHOUT CONTRAST TECHNIQUE:  Multiplanar multisequence MR imaging of the abdomen and pelvis was performed. No intravenous contrast was administered. COMPARISON:  None. FINDINGS: COMBINED FINDINGS FOR BOTH MR ABDOMEN AND PELVIS Lower chest: No acute abnormality. Hepatobiliary: Unremarkable noncontrast appearance of the hepatic parenchyma. Cholelithiasis in a distended gallbladder without wall thickening or pericholecystic fluid. No biliary ductal dilation or suspicious intraluminal filling defect. The common bile duct measures 3 mm. Pancreas: No pancreatic ductal dilation or evidence of acute inflammation. Spleen:  Within normal limits. Adrenals/Urinary Tract: No hydronephrosis. Urinary bladder is unremarkable for degree of distension. Bilateral  adrenal glands appear normal. Stomach/Bowel: The appendix is visualized in the right lower quadrant on images 8-12/6 and appears normal. Stomach is unremarkable for degree of distension. No pathologic dilation of large or small bowel. No evidence of acute bowel inflammation. Vascular/Lymphatic: No pathologically enlarged lymph nodes identified. No abdominal aortic aneurysm demonstrated. Reproductive: Single intrauterine pregnancy with placenta previa, evaluation of which is significantly limited by imaging protocol. Other:  Trace pelvic free fluid. Musculoskeletal: No acute osseous abnormality. IMPRESSION: 1. Normal appendix. 2. Cholelithiasis in a distended gallbladder without evidence of acute cholecystitis. No biliary ductal dilation or evidence of choledocholithiasis. Electronically Signed   By: Dahlia Bailiff M.D.   On: 10/23/2021 21:51   MR ABDOMEN WO CONTRAST  Result Date: 10/23/2021 CLINICAL DATA:  Right lower quadrant abdominal pain cough concern for appendicitis EXAM: MRI ABDOMEN AND PELVIS WITHOUT CONTRAST TECHNIQUE: Multiplanar multisequence MR imaging of the abdomen and pelvis was performed. No intravenous contrast was administered. COMPARISON:  None. FINDINGS: COMBINED FINDINGS FOR  BOTH MR ABDOMEN AND PELVIS Lower chest: No acute abnormality. Hepatobiliary: Unremarkable noncontrast appearance of the hepatic parenchyma. Cholelithiasis in a distended gallbladder without wall thickening or pericholecystic fluid. No biliary ductal dilation or suspicious intraluminal filling defect. The common bile duct measures 3 mm. Pancreas: No pancreatic ductal dilation or evidence of acute inflammation. Spleen:  Within normal limits. Adrenals/Urinary Tract: No hydronephrosis. Urinary bladder is unremarkable for degree of distension. Bilateral adrenal glands appear normal. Stomach/Bowel: The appendix is visualized in the right lower quadrant on images 8-12/6 and appears normal. Stomach is unremarkable for degree of distension. No pathologic dilation of large or small bowel. No evidence of acute bowel inflammation. Vascular/Lymphatic: No pathologically enlarged lymph nodes identified. No abdominal aortic aneurysm demonstrated. Reproductive: Single intrauterine pregnancy with placenta previa, evaluation of which is significantly limited by imaging protocol. Other:  Trace pelvic free fluid. Musculoskeletal: No acute osseous abnormality. IMPRESSION: 1. Normal appendix. 2. Cholelithiasis in a distended gallbladder without evidence of acute cholecystitis. No biliary ductal dilation or evidence of choledocholithiasis. Electronically Signed   By: Dahlia Bailiff M.D.   On: 10/23/2021 21:51   US OB Limited  Result Date: 10/23/2021 CLINICAL DATA:  Lower abdominal pain since 7 in the morning EXAM: LIMITED OBSTETRIC ULTRASOUND COMPARISON:  Obstetric ultrasound 10/13/2021 FINDINGS: Number of Fetuses: 1 Heart Rate:  141 bpm Movement: Yes Presentation: Cephalic Placental Location: Posterior Previa: Complete Amniotic Fluid (Subjective):  Within normal limits. BPD: 2.95 cm 15 w  3 d MATERNAL FINDINGS: Cervix:  Appears closed. Uterus/Adnexae: No abnormality visualized. IMPRESSION: 1. Single live intrauterine pregnancy with  an estimated gestational age of [redacted] weeks 3 days by biparietal diameter. 2. Placenta previa.  Recommend close obstetric follow-up. This exam is performed on an emergent basis and does not comprehensively evaluate fetal size, dating, or anatomy; follow-up complete OB US should be considered if further fetal assessment is warranted. Electronically Signed   By: Valetta Mole M.D.   On: 10/23/2021 15:46    ____________________________________________   PROCEDURES  Procedures   Medications - No data to display  Critical Care performed: No  ____________________________________________   INITIAL IMPRESSION / ASSESSMENT AND PLAN / ED COURSE  Pertinent labs & imaging results that were available during my care of the patient were reviewed by me and considered in my medical decision making (see chart for details).      Bethany Wright is a 30 y.o. female, no remarkable medical history, [redacted] weeks pregnant, presents the emergency department for evaluation of abdominal pain.  Patient reports a sudden onset of right lower quadrant pain began earlier this morning.  Reported 9/10 pain, sharp, constant, at first.  It is still present now but is 6/10.  Denies fever/chills, vaginal bleeding, nausea/vomiting, back pain, or urinary symptoms.  Differentials include, but not limited to: Serious: appendicitis, ectopic pregnancy, ovarian torsion, diverticulitis, bowel obstruction, colitis Common: UTI/pyelonephritis, renal colic, ovarian cyst, endometriosis, hernia, fibroids   Notable tenderness in the right lower quadrant with mild/moderate palpation.  No distention.  No CVA tenderness.  Vital signs are within normal limits.  CBC shows leukocytosis at 10.7.  No acute anemia.  CMP unremarkable.  UA shows leukocytes and bacteria.  Positive hCG at 30,213.  Negative lipase.  OB ultrasound shows a single live intrauterine pregnancy, gestation at 15 weeks.  Additionally shows placenta previa.  Will order MRI for  appendicitis rule out.  MRI shows cholelithiasis, otherwise no acute findings.  Normal appendix.  Will discharge this patient with short prescription of cephalexin to treat her bacteriuria.  Advised her to follow-up with her OB/GYN provider for further evaluation of placenta previa.  Encouraged patient to return the emergency department anytime she begins to experience any new or worsening symptoms.            ____________________________________________   FINAL CLINICAL IMPRESSION(S) / ED DIAGNOSES  Final diagnoses:  RLQ abdominal pain  Lower urinary tract infectious disease     NEW MEDICATIONS STARTED DURING THIS VISIT:  ED Discharge Orders          Ordered    cephALEXin (KEFLEX) 500 MG capsule  3 times daily        10/23/21 2201             Note:  This document was prepared using Dragon voice recognition software and may include unintentional dictation errors.    Teodoro Spray, [place] 10/24/21 [id number]    Vladimir Crofts, MD 10/24/21 [phone]

## 2021-10-23 NOTE — Discharge Instructions (Addendum)
-  Follow-up with your OB/GYN provider as discussed for further evaluation and management of placenta previa. -Take antibiotics as prescribed for urinary tract infection. -Please return to the emergency department anytime if you begin to experience any new or worsening symptoms.

## 2021-10-24 ENCOUNTER — Telehealth: Payer: Self-pay

## 2021-10-24 NOTE — Telephone Encounter (Signed)
Transition Care Management Unsuccessful Follow-up Telephone Call  Date of discharge and from where:  10/23/2021-[facility]  Attempts:  1st Attempt  Reason for unsuccessful TCM follow-up call:  Left voice message

## 2021-10-26 DIAGNOSIS — Z419 Encounter for procedure for purposes other than remedying health state, unspecified: Secondary | ICD-10-CM | POA: Diagnosis not present

## 2021-10-27 NOTE — Telephone Encounter (Signed)
Transition Care Management Unsuccessful Follow-up Telephone Call  Date of discharge and from where:  10/23/2021 from [facility]  Attempts:  2nd Attempt  Reason for unsuccessful TCM follow-up call:  Left voice message

## 2021-10-28 NOTE — Telephone Encounter (Signed)
Transition Care Management Unsuccessful Follow-up Telephone Call  Date of discharge and from where:  10/23/2021 from [facility]  Attempts:  3rd Attempt  Reason for unsuccessful TCM follow-up call:  Unable to reach patient

## 2021-11-14 ENCOUNTER — Ambulatory Visit
Admission: RE | Admit: 2021-11-14 | Discharge: 2021-11-14 | Disposition: A | Discharge status: Home / Self Care | Payer: BC Managed Care – PPO | Source: Ambulatory Visit | Attending: Obstetrics | Admitting: Obstetrics

## 2021-11-14 ENCOUNTER — Other Ambulatory Visit: Payer: Self-pay

## 2021-11-14 DIAGNOSIS — Z3A18 18 weeks gestation of pregnancy: Secondary | ICD-10-CM | POA: Insufficient documentation

## 2021-11-14 DIAGNOSIS — Z362 Encounter for other antenatal screening follow-up: Secondary | ICD-10-CM | POA: Insufficient documentation

## 2021-11-14 DIAGNOSIS — Z3A14 14 weeks gestation of pregnancy: Secondary | ICD-10-CM

## 2021-11-14 DIAGNOSIS — Z3480 Encounter for supervision of other normal pregnancy, unspecified trimester: Secondary | ICD-10-CM

## 2021-11-17 ENCOUNTER — Other Ambulatory Visit: Payer: Self-pay | Admitting: Obstetrics

## 2021-11-17 DIAGNOSIS — Z3480 Encounter for supervision of other normal pregnancy, unspecified trimester: Secondary | ICD-10-CM

## 2021-11-17 NOTE — Progress Notes (Signed)
ultrasoud

## 2021-11-18 ENCOUNTER — Encounter: Payer: BC Managed Care – PPO | Admitting: Advanced Practice Midwife

## 2021-11-19 ENCOUNTER — Ambulatory Visit (INDEPENDENT_AMBULATORY_CARE_PROVIDER_SITE_OTHER): Payer: BC Managed Care – PPO | Admitting: Obstetrics

## 2021-11-19 ENCOUNTER — Other Ambulatory Visit: Payer: Self-pay

## 2021-11-19 ENCOUNTER — Encounter: Payer: BC Managed Care – PPO | Admitting: Obstetrics

## 2021-11-19 VITALS — BP 118/70 | Wt 213.0 lb

## 2021-11-19 DIAGNOSIS — Z3480 Encounter for supervision of other normal pregnancy, unspecified trimester: Secondary | ICD-10-CM

## 2021-11-19 DIAGNOSIS — Z3491 Encounter for supervision of normal pregnancy, unspecified, first trimester: Secondary | ICD-10-CM

## 2021-11-19 DIAGNOSIS — Z3A18 18 weeks gestation of pregnancy: Secondary | ICD-10-CM

## 2021-11-19 NOTE — Progress Notes (Signed)
No vb. No lof.  

## 2021-11-19 NOTE — Progress Notes (Signed)
Routine Prenatal Care Visit  Subjective  Bethany Wright is a 31 y.o. G2P1001 at 110w4d being seen today for ongoing prenatal care.  She is currently monitored for the following issues for this low-risk pregnancy and has Obesity (BMI 35.0-39.9 without comorbidity) and Encounter for supervision of low-risk pregnancy in first trimester on their problem list.  ----------------------------------------------------------------------------------- Patient reports no complaints.  She is not having headaches as before. Some leg pain, and restless legs; difficulty sleeping. Contractions: Not present. Vag. Bleeding: None.   . Leaking Fluid denies.  ----------------------------------------------------------------------------------- The following portions of the patient's history were reviewed and updated as appropriate: allergies, current medications, past family history, past medical history, past social history, past surgical history and problem list. Problem list updated.  Objective  Blood pressure 118/70, weight 213 lb (96.6 kg), last menstrual period 07/12/2021. Pregravid weight 184 lb (83.5 kg) Total Weight Gain 29 lb (13.2 kg) Urinalysis: Urine Protein    Urine Glucose    Fetal Status:           General:  Alert, oriented and cooperative. Patient is in no acute distress.  Skin: Skin is warm and dry. No rash noted.   Cardiovascular: Normal heart rate noted  Respiratory: Normal respiratory effort, no problems with respiration noted  Abdomen: Soft, gravid, appropriate for gestational age.       Pelvic:  Cervical exam deferred        Extremities: Normal range of motion.     Mental Status: Normal mood and affect. Normal behavior. Normal judgment and thought content.   Assessment   31 y.o. G2P1001 at [redacted]w[redacted]d by  04/18/2022, by Last Menstrual Period presenting for routine prenatal visit  Plan   pregnancy2 Problems (from 09/12/21 to present)    Problem Noted Resolved   Encounter for supervision of  low-risk pregnancy in first trimester 09/12/2021 by Nadara Mustard, MD No   Overview Addendum 11/19/2021  8:29 AM by Mirna Mires, CNM     Nursing Staff Provider  Office Location  [facility] Dating  LMP  Language  English Anatomy US  Needs repeat  Flu Vaccine   Genetic Screen  NIPS: desires, neg female  TDaP vaccine    Hgb A1C or  GTT Third trimester :   Covid    LAB RESULTS   Rhogam   Blood Type A/Positive/-- (11/18 0902)   Feeding Plan Breast Antibody Negative (11/18 0902)  Contraception  Rubella 1.48 (11/18 0902)  Circumcision  RPR Non Reactive (11/18 0902)   Pediatrician   HBsAg Negative (11/18 0902)   Support Person  HIV Non Reactive (11/18 0902)  Prenatal Classes  Varicella     GBS  (For PCN allergy, check sensitivities)   BTL Consent     VBAC Consent n/a Pap  9/22 nml    Hgb Electro    Pelvis Tested yes                           Preterm labor symptoms and general obstetric precautions including but not limited to vaginal bleeding, contractions, leaking of fluid and fetal movement were reviewed in detail with the patient. Please refer to After Visit Summary for other counseling recommendations.  Will  Need repeat sono for anatomy in 4 weeks;Marland Kitchen  Return in about 4 weeks (around 12/17/2021) for return OB.  Mirna Mires, CNM  11/19/2021 8:43 AM

## 2021-11-26 DIAGNOSIS — Z419 Encounter for procedure for purposes other than remedying health state, unspecified: Secondary | ICD-10-CM | POA: Diagnosis not present

## 2021-12-10 ENCOUNTER — Ambulatory Visit
Admission: RE | Admit: 2021-12-10 | Discharge: 2021-12-10 | Disposition: A | Discharge status: Home / Self Care | Payer: BC Managed Care – PPO | Source: Ambulatory Visit | Attending: Obstetrics | Admitting: Obstetrics

## 2021-12-10 DIAGNOSIS — Z3480 Encounter for supervision of other normal pregnancy, unspecified trimester: Secondary | ICD-10-CM

## 2021-12-10 DIAGNOSIS — Z3689 Encounter for other specified antenatal screening: Secondary | ICD-10-CM | POA: Insufficient documentation

## 2021-12-10 DIAGNOSIS — Z3A21 21 weeks gestation of pregnancy: Secondary | ICD-10-CM | POA: Diagnosis not present

## 2021-12-12 ENCOUNTER — Encounter: Payer: Self-pay | Admitting: Obstetrics

## 2021-12-12 DIAGNOSIS — E669 Obesity, unspecified: Secondary | ICD-10-CM | POA: Insufficient documentation

## 2021-12-12 DIAGNOSIS — O99210 Obesity complicating pregnancy, unspecified trimester: Secondary | ICD-10-CM | POA: Insufficient documentation

## 2021-12-19 ENCOUNTER — Ambulatory Visit (INDEPENDENT_AMBULATORY_CARE_PROVIDER_SITE_OTHER): Payer: BC Managed Care – PPO | Admitting: Obstetrics

## 2021-12-19 ENCOUNTER — Other Ambulatory Visit: Payer: Self-pay

## 2021-12-19 VITALS — BP 126/84 | Wt 218.0 lb

## 2021-12-19 DIAGNOSIS — Z3A22 22 weeks gestation of pregnancy: Secondary | ICD-10-CM

## 2021-12-19 DIAGNOSIS — Z3480 Encounter for supervision of other normal pregnancy, unspecified trimester: Secondary | ICD-10-CM

## 2021-12-19 NOTE — Progress Notes (Signed)
No vb. No lof.  

## 2021-12-19 NOTE — Progress Notes (Signed)
Routine Prenatal Care Visit  Subjective  Bethany Wright is a 31 y.o. G2P1001 at [redacted]w[redacted]d being seen today for ongoing prenatal care.  She is currently monitored for the following issues for this low-risk pregnancy and has Obesity (BMI 35.0-39.9 without comorbidity); Encounter for supervision of low-risk pregnancy in first trimester; and Obesity, unspecified on their problem list.  ----------------------------------------------------------------------------------- Patient reports no complaints.  Her headaches have improved and the Bupap helps. She uses it sparingly. She may name the baby Enzo Contractions: Not present. Vag. Bleeding: None.   . Leaking Fluid denies.  ----------------------------------------------------------------------------------- The following portions of the patient's history were reviewed and updated as appropriate: allergies, current medications, past family history, past medical history, past social history, past surgical history and problem list. Problem list updated.  Objective  Blood pressure 126/84, weight 218 lb (98.9 kg), last menstrual period 07/12/2021. Pregravid weight 184 lb (83.5 kg) Total Weight Gain 34 lb (15.4 kg) Urinalysis: Urine Protein    Urine Glucose    Fetal Status:           General:  Alert, oriented and cooperative. Patient is in no acute distress.  Skin: Skin is warm and dry. No rash noted.   Cardiovascular: Normal heart rate noted  Respiratory: Normal respiratory effort, no problems with respiration noted  Abdomen: Soft, gravid, appropriate for gestational age. Pain/Pressure: Absent     Pelvic:  Cervical exam deferred        Extremities: Normal range of motion.  Edema: None  Mental Status: Normal mood and affect. Normal behavior. Normal judgment and thought content.   Assessment   31 y.o. G2P1001 at [redacted]w[redacted]d by  04/18/2022, by Last Menstrual Period presenting for routine prenatal visit  Plan   pregnancy2 Problems (from 09/12/21 to present)     Problem Noted Resolved   Encounter for supervision of low-risk pregnancy in first trimester 09/12/2021 by Nadara Mustard, MD No   Overview Addendum 12/12/2021  2:36 PM by Mirna Mires, CNM     Nursing Staff Provider  Office Location  [facility] Dating  LMP  Language  English Anatomy US  Needs repeat- normal female  Flu Vaccine   Genetic Screen  NIPS: desires, neg female  TDaP vaccine    Hgb A1C or  GTT Third trimester :   Covid    LAB RESULTS   Rhogam   Blood Type A/Positive/-- (11/18 0902)   Feeding Plan Breast Antibody Negative (11/18 0902)  Contraception  Rubella 1.48 (11/18 0902)  Circumcision  RPR Non Reactive (11/18 0902)   Pediatrician   HBsAg Negative (11/18 0902)   Support Person  HIV Non Reactive (11/18 0902)  Prenatal Classes  Varicella     GBS  (For PCN allergy, check sensitivities)   BTL Consent     VBAC Consent n/a Pap  9/22 nml    Hgb Electro    Pelvis Tested yes                           Preterm labor symptoms and general obstetric precautions including but not limited to vaginal bleeding, contractions, leaking of fluid and fetal movement were reviewed in detail with the patient. Please refer to After Visit Summary for other counseling recommendations.  One hr GTT next visit.  Return in about 4 weeks (around 01/16/2022) for return OB.  Mirna Mires, CNM  12/19/2021 8:46 AM

## 2021-12-24 DIAGNOSIS — Z419 Encounter for procedure for purposes other than remedying health state, unspecified: Secondary | ICD-10-CM | POA: Diagnosis not present

## 2022-01-02 ENCOUNTER — Telehealth: Payer: Self-pay

## 2022-01-02 NOTE — Telephone Encounter (Signed)
Pt calling; has yeast inf - white clumpy d/c. What to do? Is pregnant.  [id number] Pt aware to do 3d monistat; if not better to call Monday for appt Tues and not use anything down there night before appt. ?

## 2022-01-16 ENCOUNTER — Ambulatory Visit (INDEPENDENT_AMBULATORY_CARE_PROVIDER_SITE_OTHER): Payer: BC Managed Care – PPO | Admitting: Licensed Practical Nurse

## 2022-01-16 ENCOUNTER — Other Ambulatory Visit: Payer: BC Managed Care – PPO

## 2022-01-16 ENCOUNTER — Other Ambulatory Visit: Payer: Self-pay

## 2022-01-16 ENCOUNTER — Encounter: Payer: Self-pay | Admitting: Licensed Practical Nurse

## 2022-01-16 VITALS — BP 118/72 | Wt 227.0 lb

## 2022-01-16 DIAGNOSIS — Z3480 Encounter for supervision of other normal pregnancy, unspecified trimester: Secondary | ICD-10-CM

## 2022-01-16 DIAGNOSIS — Z3A26 26 weeks gestation of pregnancy: Secondary | ICD-10-CM

## 2022-01-16 LAB — POCT URINALYSIS DIPSTICK OB
Bilirubin, UA: NEGATIVE
Blood, UA: NEGATIVE
Glucose, UA: NEGATIVE
Ketones, UA: NEGATIVE
Leukocytes, UA: NEGATIVE
Nitrite, UA: NEGATIVE
POC,PROTEIN,UA: NEGATIVE
Spec Grav, UA: 1.01 (ref 1.010–1.025)
Urobilinogen, UA: 0.2 E.U./dL
pH, UA: 5 (ref 5.0–8.0)

## 2022-01-16 NOTE — Progress Notes (Signed)
Routine Prenatal Care Visit ? ?Subjective  ?Bethany Wright is a 31 y.o. G2P1001 at [redacted]w[redacted]d being seen today for ongoing prenatal care.  She is currently monitored for the following issues for this low-risk pregnancy and has Obesity (BMI 35.0-39.9 without comorbidity); Encounter for supervision of low-risk pregnancy in first trimester; and Obesity, unspecified on their problem list.  ?----------------------------------------------------------------------------------- ?Patient reports no complaints.  Doing well.  Has hx of Migraines, but has not had any in a while. Getting ready for baby, will have a shower. Has had pelvi pressure, comfort measures reviewed, does wear a support belt at work. Mood has been up and down, cries easily for no reason-but overall good.  ?Contractions: Not present. Vag. Bleeding: None.  Movement: Present. Leaking Fluid denies.  ?Wants urine tested just be sure she does not have a UTI, denies any symptoms-had UTI in December so worried about having another one-neg leuks on dip, urine not sent for culture  ?----------------------------------------------------------------------------------- ?The following portions of the patient's history were reviewed and updated as appropriate: allergies, current medications, past family history, past medical history, past social history, past surgical history and problem list. Problem list updated. ? ?Objective  ?Blood pressure 118/72, weight 227 lb (103 kg), last menstrual period 07/12/2021. ?Pregravid weight 184 lb (83.5 kg) Total Weight Gain 43 lb (19.5 kg) ?Urinalysis: Urine Protein Negative  Urine Glucose Negative ? ?Fetal Status: Fetal Heart Rate (bpm): 140 Fundal Height: 27 cm Movement: Present    ? ?General:  Alert, oriented and cooperative. Patient is in no acute distress.  ?Skin: Skin is warm and dry. No rash noted.   ?Cardiovascular: Normal heart rate noted  ?Respiratory: Normal respiratory effort, no problems with respiration noted  ?Abdomen:  Soft, gravid, appropriate for gestational age. Pain/Pressure: Present     ?Pelvic:  Cervical exam deferred        ?Extremities: Normal range of motion.  Edema: None  ?Mental Status: Normal mood and affect. Normal behavior. Normal judgment and thought content.  ? ?Assessment  ? ?31 y.o. G2P1001 at [redacted]w[redacted]d by  04/18/2022, by Last Menstrual Period presenting for routine prenatal visit ? ?Plan  ? ?pregnancy2 Problems (from 09/12/21 to present)   ? ? Problem Noted Resolved  ? Encounter for supervision of low-risk pregnancy in first trimester 09/12/2021 by Nadara Mustard, MD No  ? Overview Addendum 12/12/2021  2:36 PM by Mirna Mires, CNM  ?   ?Nursing Staff Provider  ?Office Location  [facility] Dating  LMP  ?Language  English Anatomy US  Needs repeat- normal female  ?Flu Vaccine   Genetic Screen  NIPS: desires, neg female  ?TDaP vaccine    Hgb A1C or  ?GTT Third trimester :   ?Covid    LAB RESULTS   ?Rhogam   Blood Type A/Positive/-- (11/18 4259)   ?Feeding Plan Breast Antibody Negative (11/18 0902)  ?Contraception  Rubella 1.48 (11/18 5638)  ?Circumcision  RPR Non Reactive (11/18 0902)   ?Pediatrician   HBsAg Negative (11/18 0902)   ?Support Person  HIV Non Reactive (11/18 7564)  ?Prenatal Classes  Varicella   ?  GBS  (For PCN allergy, check sensitivities)   ?BTL Consent     ?VBAC Consent n/a Pap  9/22 nml  ?  Hgb Electro    ?Pelvis Tested yes    ?      ?     ? ? ? ?  ?  ? ?  ?  ? ?Preterm labor symptoms and general obstetric precautions including  but not limited to vaginal bleeding, contractions, leaking of fluid and fetal movement were reviewed in detail with the patient. ?Please refer to After Visit Summary for other counseling recommendations.  ? ?Return in about 2 weeks (around 01/30/2022) for ROB. ? ?28 week labs collected today ? ?Carie Caddy, CNM  ?Domingo Pulse, [facility]  ?01/16/22  ?10:41 AM  ? ? ?

## 2022-01-17 LAB — 28 WEEK RH+PANEL
Basophils Absolute: 0 10*3/uL (ref 0.0–0.2)
Basos: 0 %
EOS (ABSOLUTE): 0.3 10*3/uL (ref 0.0–0.4)
Eos: 3 %
Gestational Diabetes Screen: 93 mg/dL (ref 70–139)
HIV Screen 4th Generation wRfx: NONREACTIVE
Hematocrit: 34.9 % (ref 34.0–46.6)
Hemoglobin: 11.6 g/dL (ref 11.1–15.9)
Immature Grans (Abs): 0.1 10*3/uL (ref 0.0–0.1)
Immature Granulocytes: 1 %
Lymphocytes Absolute: 1.8 10*3/uL (ref 0.7–3.1)
Lymphs: 19 %
MCH: 29.9 pg (ref 26.6–33.0)
MCHC: 33.2 g/dL (ref 31.5–35.7)
MCV: 90 fL (ref 79–97)
Monocytes Absolute: 0.5 10*3/uL (ref 0.1–0.9)
Monocytes: 5 %
Neutrophils Absolute: 7 10*3/uL (ref 1.4–7.0)
Neutrophils: 72 %
Platelets: 324 10*3/uL (ref 150–450)
RBC: 3.88 x10E6/uL (ref 3.77–5.28)
RDW: 12.6 % (ref 11.7–15.4)
RPR Ser Ql: NONREACTIVE
WBC: 9.7 10*3/uL (ref 3.4–10.8)

## 2022-01-24 DIAGNOSIS — Z419 Encounter for procedure for purposes other than remedying health state, unspecified: Secondary | ICD-10-CM | POA: Diagnosis not present

## 2022-01-29 ENCOUNTER — Ambulatory Visit (INDEPENDENT_AMBULATORY_CARE_PROVIDER_SITE_OTHER): Payer: BC Managed Care – PPO | Admitting: Obstetrics

## 2022-01-29 VITALS — BP 120/80 | Wt 231.0 lb

## 2022-01-29 DIAGNOSIS — Z3A28 28 weeks gestation of pregnancy: Secondary | ICD-10-CM

## 2022-01-29 DIAGNOSIS — Z3480 Encounter for supervision of other normal pregnancy, unspecified trimester: Secondary | ICD-10-CM

## 2022-01-29 NOTE — Progress Notes (Signed)
Routine Prenatal Care Visit ? ?Subjective  ?Bethany Wright is a 31 y.o. G2P1001 at [redacted]w[redacted]d being seen today for ongoing prenatal care.  She is currently monitored for the following issues for this high-risk pregnancy and has Obesity (BMI 35.0-39.9 without comorbidity); Encounter for supervision of low-risk pregnancy in first trimester; and Obesity, unspecified on their problem list.  ?----------------------------------------------------------------------------------- ?Patient reports no complaints.   ? .  .   . Leaking Fluid denies.  ?----------------------------------------------------------------------------------- ?The following portions of the patient's history were reviewed and updated as appropriate: allergies, current medications, past family history, past medical history, past social history, past surgical history and problem list. Problem list updated. ? ?Objective  ?Blood pressure 120/80, weight 231 lb (104.8 kg), last menstrual period 07/12/2021. ?Pregravid weight 184 lb (83.5 kg) Total Weight Gain 47 lb (21.3 kg) ?Urinalysis: Urine Protein    Urine Glucose   ? ?Fetal Status:          ? ?General:  Alert, oriented and cooperative. Patient is in no acute distress.  ?Skin: Skin is warm and dry. No rash noted.   ?Cardiovascular: Normal heart rate noted  ?Respiratory: Normal respiratory effort, no problems with respiration noted  ?Abdomen: Soft, gravid, appropriate for gestational age.       ?Pelvic:  Cervical exam deferred        ?Extremities: Normal range of motion.     ?Mental Status: Normal mood and affect. Normal behavior. Normal judgment and thought content.  ? ?Assessment  ? ?31 y.o. G2P1001 at [redacted]w[redacted]d by  04/18/2022, by Last Menstrual Period presenting for routine prenatal visit ? ?Plan  ? ?pregnancy2 Problems (from 09/12/21 to present)   ? Problem Noted Resolved  ? Encounter for supervision of low-risk pregnancy in first trimester 09/12/2021 by Gae Dry, MD No  ? Overview Addendum 01/19/2022 10:37  PM by Imagene Riches, CNM  ?   ?Nursing Staff Provider  ?Office Location  [facility] Dating  LMP  ?Language  English Anatomy US  Needs repeat- normal female  ?Flu Vaccine   Genetic Screen  NIPS: desires, neg female  ?TDaP vaccine    Hgb A1C or  ?GTT Third trimester : 68  ?Covid    LAB RESULTS   ?Rhogam   Blood Type A/Positive/-- (11/18 [id number])   ?Feeding Plan Breast Antibody Negative (11/18 0902)  ?Contraception  Rubella 1.48 (11/18 [id number])  ?Circumcision  RPR Non Reactive (11/18 0902)   ?Pediatrician   HBsAg Negative (11/18 0902)   ?Support Person  HIV Non Reactive (11/18 [id number])  ?Prenatal Classes  Varicella   ?  GBS  (For PCN allergy, check sensitivities)   ?BTL Consent     ?VBAC Consent n/a Pap  9/22 nml  ?  Hgb Electro    ?Pelvis Tested yes    ?      ?     ? ? ? ?  ?  ?  ?  ? ?Preterm labor symptoms and general obstetric precautions including but not limited to vaginal bleeding, contractions, leaking of fluid and fetal movement were reviewed in detail with the patient. ?Please refer to After Visit Summary for other counseling recommendations.  ?Her weight gain this pregnancy is higher- this is gently addressed today. ? ?No follow-ups on file. ? ?Imagene Riches, CNM  ?01/29/2022 8:57 AM   ? ?

## 2022-01-29 NOTE — Progress Notes (Signed)
ROB- no concerns 

## 2022-01-29 NOTE — Progress Notes (Signed)
Routine Prenatal Care Visit ? ?Subjective  ?Bethany Wright is a 31 y.o. G2P1001 at [redacted]w[redacted]d being seen today for ongoing prenatal care.  She is currently monitored for the following issues for this low-risk pregnancy and has Obesity (BMI 35.0-39.9 without comorbidity); Encounter for supervision of low-risk pregnancy in first trimester; and Obesity, unspecified on their problem list.  ?----------------------------------------------------------------------------------- ?Patient reports no complaints.   ? .  .   . Leaking Fluid denies.  ?----------------------------------------------------------------------------------- ?The following portions of the patient's history were reviewed and updated as appropriate: allergies, current medications, past family history, past medical history, past social history, past surgical history and problem list. Problem list updated. ? ?Objective  ?Blood pressure 120/80, weight 231 lb (104.8 kg), last menstrual period 07/12/2021. ?Pregravid weight 184 lb (83.5 kg) Total Weight Gain 47 lb (21.3 kg) ?Urinalysis: Urine Protein    Urine Glucose   ? ?Fetal Status:          ? ?General:  Alert, oriented and cooperative. Patient is in no acute distress.  ?Skin: Skin is warm and dry. No rash noted.   ?Cardiovascular: Normal heart rate noted  ?Respiratory: Normal respiratory effort, no problems with respiration noted  ?Abdomen: Soft, gravid, appropriate for gestational age.       ?Pelvic:  Cervical exam deferred        ?Extremities: Normal range of motion.     ?Mental Status: Normal mood and affect. Normal behavior. Normal judgment and thought content.  ? ?Assessment  ? ?31 y.o. G2P1001 at [redacted]w[redacted]d by  04/18/2022, by Last Menstrual Period presenting for routine prenatal visit ? ?Plan  ? ?pregnancy2 Problems (from 09/12/21 to present)   ? ? Problem Noted Resolved  ? Encounter for supervision of low-risk pregnancy in first trimester 09/12/2021 by Nadara Mustard, MD No  ? Overview Addendum 01/19/2022  10:37 PM by Mirna Mires, CNM  ?   ?Nursing Staff Provider  ?Office Location  [facility] Dating  LMP  ?Language  English Anatomy US  Needs repeat- normal female  ?Flu Vaccine   Genetic Screen  NIPS: desires, neg female  ?TDaP vaccine    Hgb A1C or  ?GTT Third trimester : 68  ?Covid    LAB RESULTS   ?Rhogam   Blood Type A/Positive/-- (11/18 4680)   ?Feeding Plan Breast Antibody Negative (11/18 0902)  ?Contraception  Rubella 1.48 (11/18 3212)  ?Circumcision  RPR Non Reactive (11/18 0902)   ?Pediatrician   HBsAg Negative (11/18 0902)   ?Support Person  HIV Non Reactive (11/18 2482)  ?Prenatal Classes  Varicella   ?  GBS  (For PCN allergy, check sensitivities)   ?BTL Consent     ?VBAC Consent n/a Pap  9/22 nml  ?  Hgb Electro    ?Pelvis Tested yes    ?      ?     ? ? ? ?  ?  ? ?  ?  ? ?Preterm labor symptoms and general obstetric precautions including but not limited to vaginal bleeding, contractions, leaking of fluid and fetal movement were reviewed in detail with the patient. ?Please refer to After Visit Summary for other counseling recommendations.  ? ? ? ?No follow-ups on file. ? ?Mirna Mires, CNM  ?01/29/2022 8:28 AM   ? ?

## 2022-02-12 ENCOUNTER — Encounter: Payer: Self-pay | Admitting: Licensed Practical Nurse

## 2022-02-12 ENCOUNTER — Ambulatory Visit (INDEPENDENT_AMBULATORY_CARE_PROVIDER_SITE_OTHER): Payer: BC Managed Care – PPO | Admitting: Licensed Practical Nurse

## 2022-02-12 VITALS — BP 120/60 | Wt 236.0 lb

## 2022-02-12 DIAGNOSIS — Z3480 Encounter for supervision of other normal pregnancy, unspecified trimester: Secondary | ICD-10-CM

## 2022-02-12 DIAGNOSIS — Z3A30 30 weeks gestation of pregnancy: Secondary | ICD-10-CM

## 2022-02-12 DIAGNOSIS — Z3491 Encounter for supervision of normal pregnancy, unspecified, first trimester: Secondary | ICD-10-CM

## 2022-02-12 LAB — POCT URINALYSIS DIPSTICK OB
Glucose, UA: NEGATIVE
POC,PROTEIN,UA: NEGATIVE

## 2022-02-12 NOTE — Progress Notes (Signed)
Routine Prenatal Care Visit ? ?Subjective  ?Bethany Wright is a 31 y.o. G2P1001 at [redacted]w[redacted]d being seen today for ongoing prenatal care.  She is currently monitored for the following issues for this low-risk pregnancy and has Obesity (BMI 35.0-39.9 without comorbidity); Encounter for supervision of low-risk pregnancy in first trimester; and Obesity, unspecified on their problem list.  ?----------------------------------------------------------------------------------- ?Patient reports  sleep disturbances d/t restless legs, taking magnesium .   ?-got light headed after being in reclined positions for a while suing exam, turned to her left side and felt better fairly quickly.  ?Contractions: Not present. Vag. Bleeding: None.  Movement: Present. Leaking Fluid denies.  ?----------------------------------------------------------------------------------- ?The following portions of the patient's history were reviewed and updated as appropriate: allergies, current medications, past family history, past medical history, past social history, past surgical history and problem list. Problem list updated. ? ?Objective  ?Blood pressure 120/60, weight 236 lb (107 kg), last menstrual period 07/12/2021. ?Pregravid weight 184 lb (83.5 kg) Total Weight Gain 52 lb (23.6 kg) ?Urinalysis: Urine Protein    Urine Glucose   ? ?Fetal Status: Fetal Heart Rate (bpm): 136 Fundal Height: 31 cm Movement: Present    ? ?General:  Alert, oriented and cooperative. Patient is in no acute distress.  ?Skin: Skin is warm and dry. No rash noted.   ?Cardiovascular: Normal heart rate noted  ?Respiratory: Normal respiratory effort, no problems with respiration noted  ?Abdomen: Soft, gravid, appropriate for gestational age. Pain/Pressure: Absent     ?Pelvic:  Cervical exam deferred        ?Extremities: Normal range of motion.  Edema: None  ?Mental Status: Normal mood and affect. Normal behavior. Normal judgment and thought content.  ? ?Assessment  ? ?31 y.o.  G2P1001 at [redacted]w[redacted]d by  04/18/2022, by Last Menstrual Period presenting for routine prenatal visit ? ?Plan  ? ?pregnancy2 Problems (from 09/12/21 to present)   ? ? Problem Noted Resolved  ? Encounter for supervision of low-risk pregnancy in first trimester 09/12/2021 by Nadara Mustard, MD No  ? Overview Addendum 02/12/2022  1:35 PM by Ellwood Sayers, CNM  ?   ?Nursing Staff Provider  ?Office Location  [facility] Dating  LMP  ?Language  English Anatomy US  Needs repeat- normal female  ?Flu Vaccine   Genetic Screen  NIPS: desires, neg female  ?TDaP vaccine    Hgb A1C or  ?GTT Third trimester : 27  ?Covid    LAB RESULTS   ?Rhogam  NA Blood Type A/Positive/-- (11/18 1610)   ?Feeding Plan Breast Antibody Negative (11/18 0902)  ?Contraception  Rubella 1.48 (11/18 9604)  ?Circumcision  RPR Non Reactive (11/18 0902)   ?Pediatrician   HBsAg Negative (11/18 0902)   ?Support Person  HIV Non Reactive (11/18 5409)  ?Prenatal Classes  Varicella immune  ?  GBS  (For PCN allergy, check sensitivities)   ?BTL Consent     ?VBAC Consent n/a Pap  9/22 nml  ?  Hgb Electro    ?Pelvis Tested yes    ?      ?     ? ? ? ?  ?  ? ?  ?  ? ?Preterm labor symptoms and general obstetric precautions including but not limited to vaginal bleeding, contractions, leaking of fluid and fetal movement were reviewed in detail with the patient. ?Please refer to After Visit Summary for other counseling recommendations.  ? ?Return in about 2 weeks (around 02/26/2022) for ROB. ? ?Carie Caddy, CNM  ?Dom[facility] Pulse, [facility]  ?02/12/22  ?1:37 PM  ? ? ?

## 2022-02-23 DIAGNOSIS — Z419 Encounter for procedure for purposes other than remedying health state, unspecified: Secondary | ICD-10-CM | POA: Diagnosis not present

## 2022-02-26 ENCOUNTER — Encounter: Payer: Self-pay | Admitting: Advanced Practice Midwife

## 2022-02-26 ENCOUNTER — Encounter: Payer: BC Managed Care – PPO | Admitting: Obstetrics

## 2022-02-26 ENCOUNTER — Ambulatory Visit (INDEPENDENT_AMBULATORY_CARE_PROVIDER_SITE_OTHER): Payer: BC Managed Care – PPO | Admitting: Advanced Practice Midwife

## 2022-02-26 VITALS — BP 100/70 | Wt 240.0 lb

## 2022-02-26 DIAGNOSIS — Z3A32 32 weeks gestation of pregnancy: Secondary | ICD-10-CM

## 2022-02-26 DIAGNOSIS — Z3493 Encounter for supervision of normal pregnancy, unspecified, third trimester: Secondary | ICD-10-CM

## 2022-02-26 LAB — POCT URINALYSIS DIPSTICK OB: Glucose, UA: NEGATIVE

## 2022-02-26 NOTE — Progress Notes (Addendum)
Routine Prenatal Care Visit ? ?Subjective  ?Bethany Wright is a 31 y.o. G2P1001 at [redacted]w[redacted]d being seen today for ongoing prenatal care.  She is currently monitored for the following issues for this low-risk pregnancy and has Obesity (BMI 35.0-39.9 without comorbidity); Encounter for supervision of low-risk pregnancy in first trimester; and Obesity, unspecified on their problem list.  ?----------------------------------------------------------------------------------- ?Patient reports no complaints.  We discussed healthy pregnancy diet and physical activity in the setting of having gained 56 pounds so far this pregnancy.  ?Contractions: Not present. Vag. Bleeding: None.  Movement: Present. Leaking Fluid denies.  ?----------------------------------------------------------------------------------- ?The following portions of the patient's history were reviewed and updated as appropriate: allergies, current medications, past family history, past medical history, past social history, past surgical history and problem list. Problem list updated. ? ?Objective  ?Blood pressure 100/70, weight 240 lb (108.9 kg), last menstrual period 07/12/2021. ?Pregravid weight 184 lb (83.5 kg) Total Weight Gain 56 lb (25.4 kg) ?Urinalysis: Urine Protein    Urine Glucose   ? ?Fetal Status: Fetal Heart Rate (bpm): 141 Fundal Height: 34 cm Movement: Present    ? ?General:  Alert, oriented and cooperative. Patient is in no acute distress.  ?Skin: Skin is warm and dry. No rash noted.   ?Cardiovascular: Normal heart rate noted  ?Respiratory: Normal respiratory effort, no problems with respiration noted  ?Abdomen: Soft, gravid, appropriate for gestational age. Pain/Pressure: Absent     ?Pelvic:  Cervical exam deferred        ?Extremities: Normal range of motion.  Edema: None  ?Mental Status: Normal mood and affect. Normal behavior. Normal judgment and thought content.  ? ?Assessment  ? ?31 y.o. G2P1001 at [redacted]w[redacted]d by  04/18/2022, by Last Menstrual  Period presenting for routine prenatal visit ? ?Plan  ? ?pregnancy2 Problems (from 09/12/21 to present)   ? ? Problem Noted Resolved  ? Encounter for supervision of low-risk pregnancy in first trimester 09/12/2021 by Nadara Mustard, MD No  ? Overview Addendum 02/12/2022  1:35 PM by Ellwood Sayers, CNM  ?   ?Nursing Staff Provider  ?Office Location  [facility] Dating  LMP  ?Language  English Anatomy US  Needs repeat- normal female  ?Flu Vaccine   Genetic Screen  NIPS: desires, neg female  ?TDaP vaccine    Hgb A1C or  ?GTT Third trimester : 19  ?Covid    LAB RESULTS   ?Rhogam  NA Blood Type A/Positive/-- (11/18 4010)   ?Feeding Plan Breast Antibody Negative (11/18 0902)  ?Contraception  Rubella 1.48 (11/18 2725)  ?Circumcision  RPR Non Reactive (11/18 0902)   ?Pediatrician   HBsAg Negative (11/18 0902)   ?Support Person  HIV Non Reactive (11/18 3664)  ?Prenatal Classes  Varicella immune  ?  GBS  (For PCN allergy, check sensitivities)   ?BTL Consent     ?VBAC Consent n/a Pap  9/22 nml  ?  Hgb Electro    ?Pelvis Tested yes    ?      ?     ? ? ? ?  ?  ? ?  ?  ? ?Preterm labor symptoms and general obstetric precautions including but not limited to vaginal bleeding, contractions, leaking of fluid and fetal movement were reviewed in detail with the patient. ?  ? ?Return in about 2 weeks (around 03/12/2022) for rob. ? ?Tresea Mall, CNM ?02/26/2022 9:43 AM   ? ?

## 2022-02-26 NOTE — Addendum Note (Signed)
Addended by: Cornelius Moras D on: 02/26/2022 09:48 AM ? ? Modules accepted: Orders ? ?

## 2022-03-01 ENCOUNTER — Encounter: Payer: Self-pay | Admitting: Obstetrics and Gynecology

## 2022-03-01 ENCOUNTER — Ambulatory Visit
Admission: EM | Admit: 2022-03-01 | Discharge: 2022-03-01 | Payer: BC Managed Care – PPO | Attending: Emergency Medicine | Admitting: Emergency Medicine

## 2022-03-01 ENCOUNTER — Other Ambulatory Visit: Payer: Self-pay

## 2022-03-01 ENCOUNTER — Observation Stay
Admission: EM | Admit: 2022-03-01 | Discharge: 2022-03-01 | Disposition: A | Discharge status: Home / Self Care | Payer: BC Managed Care – PPO | Attending: Obstetrics | Admitting: Obstetrics

## 2022-03-01 ENCOUNTER — Encounter: Payer: Self-pay | Admitting: Emergency Medicine

## 2022-03-01 DIAGNOSIS — O99513 Diseases of the respiratory system complicating pregnancy, third trimester: Secondary | ICD-10-CM | POA: Diagnosis not present

## 2022-03-01 DIAGNOSIS — O26893 Other specified pregnancy related conditions, third trimester: Secondary | ICD-10-CM | POA: Diagnosis not present

## 2022-03-01 DIAGNOSIS — Z3491 Encounter for supervision of normal pregnancy, unspecified, first trimester: Secondary | ICD-10-CM

## 2022-03-01 DIAGNOSIS — O163 Unspecified maternal hypertension, third trimester: Secondary | ICD-10-CM | POA: Diagnosis not present

## 2022-03-01 DIAGNOSIS — J029 Acute pharyngitis, unspecified: Secondary | ICD-10-CM

## 2022-03-01 DIAGNOSIS — R030 Elevated blood-pressure reading, without diagnosis of hypertension: Secondary | ICD-10-CM

## 2022-03-01 DIAGNOSIS — Z3A33 33 weeks gestation of pregnancy: Secondary | ICD-10-CM

## 2022-03-01 DIAGNOSIS — R112 Nausea with vomiting, unspecified: Secondary | ICD-10-CM

## 2022-03-01 LAB — CBC
HCT: 33.9 % — ABNORMAL LOW (ref 36.0–46.0)
Hemoglobin: 11 g/dL — ABNORMAL LOW (ref 12.0–15.0)
MCH: 27.2 pg (ref 26.0–34.0)
MCHC: 32.4 g/dL (ref 30.0–36.0)
MCV: 83.9 fL (ref 80.0–100.0)
Platelets: 363 10*3/uL (ref 150–400)
RBC: 4.04 MIL/uL (ref 3.87–5.11)
RDW: 13.5 % (ref 11.5–15.5)
WBC: 11.2 10*3/uL — ABNORMAL HIGH (ref 4.0–10.5)
nRBC: 0 % (ref 0.0–0.2)

## 2022-03-01 LAB — COMPREHENSIVE METABOLIC PANEL
ALT: 14 U/L (ref 0–44)
AST: 14 U/L — ABNORMAL LOW (ref 15–41)
Albumin: 2.9 g/dL — ABNORMAL LOW (ref 3.5–5.0)
Alkaline Phosphatase: 92 U/L (ref 38–126)
Anion gap: 8 (ref 5–15)
BUN: 9 mg/dL (ref 6–20)
CO2: 21 mmol/L — ABNORMAL LOW (ref 22–32)
Calcium: 8.6 mg/dL — ABNORMAL LOW (ref 8.9–10.3)
Chloride: 107 mmol/L (ref 98–111)
Creatinine, Ser: 0.54 mg/dL (ref 0.44–1.00)
GFR, Estimated: >60 mL/min (ref >60)
Glucose, Bld: 94 mg/dL (ref 70–99)
Potassium: 3.9 mmol/L (ref 3.5–5.1)
Sodium: 136 mmol/L (ref 135–145)
Total Bilirubin: 0.5 mg/dL (ref 0.3–1.2)
Total Protein: 6.7 g/dL (ref 6.5–8.1)

## 2022-03-01 LAB — PROTEIN / CREATININE RATIO, URINE
Creatinine, Urine: 288 mg/dL
Protein Creatinine Ratio: 0.13 mg/mg{Cre} (ref 0.00–0.15)
Total Protein, Urine: 37 mg/dL

## 2022-03-01 LAB — POCT RAPID STREP A (OFFICE): Rapid Strep A Screen: NEGATIVE

## 2022-03-01 MED ORDER — ACETAMINOPHEN 325 MG PO TABS: 650.0000 mg | ORAL_TABLET | ORAL | Status: DC | PRN

## 2022-03-01 NOTE — OB Triage Note (Signed)
LABOR & DELIVERY OB TRIAGE NOTE ? ?SUBJECTIVE ? ?HPI ?Bethany Wright is a 31 y.o. G2P1001 at [redacted]w[redacted]d who presents to Labor & Delivery on the recommendation of urgent care. She went to urgent care this morning for a sore throat and her blood pressure was found to be elevated. She denies HA, visual changes, and epigastric pain. She denies ctx, LOF, and vaginal bleeding. She denies any history of CHTN or preeclampsia. ? ?OB History   ? ? Gravida  ?2  ? Para  ?1  ? Term  ?1  ? Preterm  ?   ? AB  ?   ? Living  ?1  ?  ? ? SAB  ?   ? IAB  ?   ? Ectopic  ?   ? Multiple  ?   ? Live Births  ?1  ?   ?  ?  ? ? ?Scheduled Meds: ?Continuous Infusions: ?PRN Meds:.acetaminophen ? ?OBJECTIVE ? ?BP 120/79   Pulse 91   Temp 97.7 ?F (36.5 ?C) (Oral)   Resp 20   Ht 5\' 7"  (1.702 m)   Wt 108.9 kg   LMP 07/12/2021   BMI 37.59 kg/m?  ? ?Cardio: RRR, no murmur ?Lungs: CTAB ?Abdomen: soft, gravid, non-tender, normal bowel sounds ?Extremities: no edema. DTR 1+, no clonus ? ?NST ?I reviewed the NST and it was reactive. ? ?Baseline: 140 ?Variability: moderate ?Accelerations: present, 15x15 ?Decelerations:none ?Toco: irritability ?Category ? ?Labs ?CBC ?   ?Component Value Date/Time  ? WBC 11.2 (H) 03/01/2022 1009  ? RBC 4.04 03/01/2022 1009  ? HGB 11.0 (L) 03/01/2022 1009  ? HGB 11.6 01/16/2022 0928  ? HCT 33.9 (L) 03/01/2022 1009  ? HCT 34.9 01/16/2022 0928  ? PLT 363 03/01/2022 1009  ? PLT 324 01/16/2022 0928  ? MCV 83.9 03/01/2022 1009  ? MCV 90 01/16/2022 0928  ? MCH 27.2 03/01/2022 1009  ? MCHC 32.4 03/01/2022 1009  ? RDW 13.5 03/01/2022 1009  ? RDW 12.6 01/16/2022 0928  ? LYMPHSABS 1.8 01/16/2022 0928  ? EOSABS 0.3 01/16/2022 0928  ? BASOSABS 0.0 01/16/2022 0928  ? ?CMP  ?   ?Component Value Date/Time  ? NA 136 03/01/2022 1009  ? K 3.9 03/01/2022 1009  ? CL 107 03/01/2022 1009  ? CO2 21 (L) 03/01/2022 1009  ? GLUCOSE 94 03/01/2022 1009  ? BUN 9 03/01/2022 1009  ? CREATININE 0.54 03/01/2022 1009  ? CALCIUM 8.6 (L) 03/01/2022 1009  ?  PROT 6.7 03/01/2022 1009  ? ALBUMIN 2.9 (L) 03/01/2022 1009  ? AST 14 (L) 03/01/2022 1009  ? ALT 14 03/01/2022 1009  ? ALKPHOS 92 03/01/2022 1009  ? BILITOT 0.5 03/01/2022 1009  ? GFRNONAA >60 03/01/2022 1009  ? . ? ?ASSESSMENT ?Impression ? ?1) Pregnancy at G2P1001, [redacted]w[redacted]d, Estimated Date of Delivery: 04/18/22 ?2) Reassuring maternal/fetal status ?3) Normotensive without s/s of preeclampsia. Labs WNL. ? ?PLAN  ?1) Discharge home with standard return precautions. Reviewed danger signs. ?2) Reviewed comfort measures for sore throat. ?3) Keep scheduled ROB appointment. ? ?Lloyd Huger, CNM ?

## 2022-03-01 NOTE — Discharge Instructions (Addendum)
Go to the emergency department for evaluation of your elevated blood pressure while you are [redacted] weeks pregnant. ? ?Blood pressure 142/96 and rechecked at 133/90.   ? ? ?

## 2022-03-01 NOTE — ED Triage Notes (Addendum)
Pt presents with ST and bilateral ear pain since yesterday. Pt is [redacted] weeks pregnant.  ?

## 2022-03-01 NOTE — ED Notes (Signed)
Patient is being discharged from the Urgent Care and sent to the Emergency Department via personal vehicle . Per Barkley Boards, NP, patient is in need of higher level of care due to hypertension in pregnancy. Patient is aware and verbalizes understanding of plan of care.  ?Vitals:  ? 03/01/22 0814 03/01/22 0828  ?BP: (!) 142/96 133/90  ?Pulse: (!) 101   ?Resp: 18   ?Temp: 98.2 ?F (36.8 ?C)   ?SpO2: 97%   ?  ?

## 2022-03-01 NOTE — Progress Notes (Signed)
Patient given discharge instructions, patient verbalized understanding. Patient discharged home, ambulatory in good condition by self.  ?

## 2022-03-01 NOTE — OB Triage Note (Signed)
Patient is a G2P1, [redacted]w[redacted]d that presents with c/o sore throat, nasal drainage and bilateral ear pain. Patient was at urgent care for symptoms and had a high BP, the urgent care referred her to L&D. Patient states no headache, visual disturbances or RUQ pain. Patient denies any LOF or bleeding. +FM reported. Patient denies contractions. VSS. Monitors applied and assessing.  ?

## 2022-03-01 NOTE — ED Provider Notes (Signed)
?[facility] ? ? ? ?CSN: [id number] ?Arrival date & time: 03/01/22  0808 ? ? ?  ? ?History   ?Chief Complaint ?Chief Complaint  ?Patient presents with  ? Sore Throat  ? Otalgia  ? ? ?HPI ?Bethany Wright is a 31 y.o. female.  Patient is [redacted] weeks pregnant.  She presents with sore throat and ear ache since yesterday.  One episode of emesis this morning.  No fever, cough, shortness of breath, abdominal pain, diarrhea, or other symptoms.  No treatments at home.  ? ?The history is provided by the patient and medical records.  ? ?History reviewed. No pertinent past medical history. ? ?Patient Active Problem List  ? Diagnosis Date Noted  ? Obesity, unspecified 12/12/2021  ? Encounter for supervision of low-risk pregnancy in first trimester 09/12/2021  ? Obesity (BMI 35.0-39.9 without comorbidity) 10/07/2020  ? ? ?History reviewed. No pertinent surgical history. ? ?OB History   ? ? Gravida  ?2  ? Para  ?1  ? Term  ?1  ? Preterm  ?   ? AB  ?   ? Living  ?1  ?  ? ? SAB  ?   ? IAB  ?   ? Ectopic  ?   ? Multiple  ?   ? Live Births  ?1  ?   ?  ?  ? ? ? ?Home Medications   ? ?Prior to Admission medications   ?Medication Sig Start Date End Date Taking? Authorizing Provider  ?Butalbital-APAP-Caffeine 50-325-40 MG capsule Take 1-2 capsules by mouth every 6 (six) hours as needed for headache. ?Patient not taking: Reported on 01/29/2022 10/21/21   Mirna Mires, CNM  ? ? ?Family History ?Family History  ?Problem Relation Age of Onset  ? Hypertension Mother   ? ? ?Social History ?Social History  ? ?Tobacco Use  ? Smoking status: Never  ? Smokeless tobacco: Never  ?Vaping Use  ? Vaping Use: Never used  ?Substance Use Topics  ? Alcohol use: No  ? Drug use: No  ? ? ? ?Allergies   ?Patient has no known allergies. ? ? ?Review of Systems ?Review of Systems  ?Constitutional:  Negative for chills and fever.  ?HENT:  Positive for ear pain and sore throat.   ?Respiratory:  Negative for cough and shortness of breath.   ?Cardiovascular:   Negative for chest pain and palpitations.  ?Gastrointestinal:  Positive for vomiting. Negative for abdominal pain and diarrhea.  ?Genitourinary:  Negative for dysuria and hematuria.  ?Skin:  Negative for color change and rash.  ?All other systems reviewed and are negative. ? ? ?Physical Exam ?Triage Vital Signs ?ED Triage Vitals  ?Enc Vitals Group  ?   BP   ?   Pulse   ?   Resp   ?   Temp   ?   Temp src   ?   SpO2   ?   Weight   ?   Height   ?   Head Circumference   ?   Peak Flow   ?   Pain Score   ?   Pain Loc   ?   Pain Edu?   ?   Excl. in GC?   ? ?No data found. ? ?Updated Vital Signs ?BP 133/90   Pulse (!) 101   Temp 98.2 ?F (36.8 ?C)   Resp 18   LMP 07/12/2021   SpO2 97%  ? ?Visual Acuity ?Right Eye Distance:   ?Left Eye Distance:   ?  Bilateral Distance:   ? ?Right Eye Near:   ?Left Eye Near:    ?Bilateral Near:    ? ?Physical Exam ?Vitals and nursing note reviewed.  ?Constitutional:   ?   General: She is not in acute distress. ?   Appearance: Normal appearance. She is well-developed. She is not ill-appearing.  ?HENT:  ?   Right Ear: Tympanic membrane normal.  ?   Left Ear: Tympanic membrane normal.  ?   Nose: Nose normal.  ?   Mouth/Throat:  ?   Mouth: Mucous membranes are moist.  ?   Pharynx: Oropharynx is clear.  ?Cardiovascular:  ?   Rate and Rhythm: Normal rate and regular rhythm.  ?   Heart sounds: Normal heart sounds.  ?Pulmonary:  ?   Effort: Pulmonary effort is normal. No respiratory distress.  ?   Breath sounds: Normal breath sounds.  ?Abdominal:  ?   Palpations: Abdomen is soft.  ?   Tenderness: There is no abdominal tenderness.  ?Musculoskeletal:  ?   Cervical back: Neck supple.  ?Skin: ?   General: Skin is warm and dry.  ?Neurological:  ?   Mental Status: She is alert.  ?Psychiatric:     ?   Mood and Affect: Mood normal.     ?   Behavior: Behavior normal.  ? ? ? ?UC Treatments / Results  ?Labs ?(all labs ordered are listed, but only abnormal results are displayed) ?Labs Reviewed  ?POCT RAPID  STREP A (OFFICE)  ? ? ?EKG ? ? ?Radiology ?No results found. ? ?Procedures ?Procedures (including critical care time) ? ?Medications Ordered in UC ?Medications - No data to display ? ?Initial Impression / Assessment and Plan / UC Course  ?I have reviewed the triage vital signs and the nursing notes. ? ?Pertinent labs & imaging results that were available during my care of the patient were reviewed by me and considered in my medical decision making (see chart for details). ? ?  ?[redacted] weeks pregnant, elevated blood pressure reading, nausea and vomiting, sore throat.  Blood pressure 142/96 and then repeated at 133/90.  Rapid strep negative.  Sending patient to the ED for evaluation of her elevated blood pressure while [redacted] weeks pregnant.  She will go to [facility] ED as this is where she plans to deliver.  She drove herself here and feels stable to drive herself to the ED.  She declines EMS. ? ?Final Clinical Impressions(s) / UC Diagnoses  ? ?Final diagnoses:  ?[redacted] weeks gestation of pregnancy  ?Elevated blood pressure reading  ?Nausea and vomiting, unspecified vomiting type  ?Sore throat  ? ? ? ?Discharge Instructions   ? ?  ?Go to the emergency department for evaluation of your elevated blood pressure while you are [redacted] weeks pregnant. ? ?Blood pressure 142/96 and rechecked at 133/90.   ? ? ? ? ? ? ?ED Prescriptions   ?None ?  ? ?PDMP not reviewed this encounter. ?  ?Mickie Bail, NP ?03/01/22 [phone] ? ?

## 2022-03-02 ENCOUNTER — Telehealth: Payer: Self-pay

## 2022-03-02 NOTE — Telephone Encounter (Signed)
Transition Care Management Unsuccessful Follow-up Telephone Call ? ?Date of discharge and from where:  03/01/2022-[facility] ? ?Attempts:  1st Attempt ? ?Reason for unsuccessful TCM follow-up call:  Left voice message ? ?  ?

## 2022-03-03 NOTE — Telephone Encounter (Signed)
Transition Care Management Unsuccessful Follow-up Telephone Call ? ?Date of discharge and from where:  03/01/2022-[facility] ? ?Attempts:  2nd Attempt ? ?Reason for unsuccessful TCM follow-up call:  Left voice message ? ? ? ?

## 2022-03-04 NOTE — Telephone Encounter (Signed)
Transition Care Management Unsuccessful Follow-up Telephone Call ? ?Date of discharge and from where:  03/01/2022-[facility] ?  ? ?Attempts:  3rd Attempt ? ?Reason for unsuccessful TCM follow-up call:  Unable to reach patient ? ? ? ?

## 2022-03-12 ENCOUNTER — Encounter: Payer: Self-pay | Admitting: Obstetrics and Gynecology

## 2022-03-12 ENCOUNTER — Ambulatory Visit (INDEPENDENT_AMBULATORY_CARE_PROVIDER_SITE_OTHER): Payer: BC Managed Care – PPO | Admitting: Obstetrics and Gynecology

## 2022-03-12 ENCOUNTER — Telehealth: Payer: Self-pay

## 2022-03-12 VITALS — BP 120/80 | Wt 243.0 lb

## 2022-03-12 DIAGNOSIS — O99210 Obesity complicating pregnancy, unspecified trimester: Secondary | ICD-10-CM

## 2022-03-12 DIAGNOSIS — R8271 Bacteriuria: Secondary | ICD-10-CM | POA: Insufficient documentation

## 2022-03-12 DIAGNOSIS — Z3491 Encounter for supervision of normal pregnancy, unspecified, first trimester: Secondary | ICD-10-CM

## 2022-03-12 NOTE — Progress Notes (Signed)
   PRENATAL VISIT NOTE  Subjective:  Bethany Wright is a 31 y.o. G2P1001 at [redacted]w[redacted]d being seen today for ongoing prenatal care.  She is currently monitored for the following issues for this low-risk pregnancy and has Obesity (BMI 35.0-39.9 without comorbidity); Encounter for supervision of low-risk pregnancy in first trimester; Maternal obesity affecting pregnancy, antepartum; Labor and delivery, indication for care; and GBS bacteriuria on their problem list.  Patient reports no complaints.  Contractions: Not present. Vag. Bleeding: None.  Movement: Present. Denies leaking of fluid.   The following portions of the patient's history were reviewed and updated as appropriate: allergies, current medications, past family history, past medical history, past social history, past surgical history and problem list.   Objective:   Vitals:   03/12/22 0940  BP: 120/80  Weight: 243 lb (110.2 kg)    Fetal Status: Fetal Heart Rate (bpm): 135 Fundal Height: 35 cm Movement: Present     General:  Alert, oriented and cooperative. Patient is in no acute distress.  Skin: Skin is warm and dry. No rash noted.   Cardiovascular: Normal heart rate noted  Respiratory: Normal respiratory effort, no problems with respiration noted  Abdomen: Soft, gravid, appropriate for gestational age.  Pain/Pressure: Present     Pelvic: Cervical exam deferred        Extremities: Normal range of motion.     Mental Status: Normal mood and affect. Normal behavior. Normal judgment and thought content.   Assessment and Plan:  Pregnancy: G2P1001 at [redacted]w[redacted]d 1. Encounter for supervision of low-risk pregnancy in first trimester Patient is doing well without complaints  2. Maternal obesity affecting pregnancy, antepartum Patient is not taking ASA Normal 1 hour glucola  3. GBS bacteriuria Prophyalxis in labor  Preterm labor symptoms and general obstetric precautions including but not limited to vaginal bleeding, contractions,  leaking of fluid and fetal movement were reviewed in detail with the patient. Please refer to After Visit Summary for other counseling recommendations.   Return in about 2 weeks (around 03/26/2022) for in person, ROB, Low risk.  No future appointments.  Catalina Antigua, MD

## 2022-03-12 NOTE — Telephone Encounter (Signed)
FMLA/DISABILITY form for [employer] filled out and signature obtained; them will process.

## 2022-03-18 ENCOUNTER — Encounter: Payer: Self-pay | Admitting: Obstetrics

## 2022-03-18 ENCOUNTER — Observation Stay
Admission: EM | Admit: 2022-03-18 | Discharge: 2022-03-18 | Disposition: A | Discharge status: Home / Self Care | Payer: BC Managed Care – PPO | Attending: Obstetrics | Admitting: Obstetrics

## 2022-03-18 DIAGNOSIS — O26893 Other specified pregnancy related conditions, third trimester: Secondary | ICD-10-CM | POA: Diagnosis not present

## 2022-03-18 DIAGNOSIS — M549 Dorsalgia, unspecified: Secondary | ICD-10-CM | POA: Insufficient documentation

## 2022-03-18 DIAGNOSIS — O99891 Other specified diseases and conditions complicating pregnancy: Secondary | ICD-10-CM | POA: Diagnosis not present

## 2022-03-18 DIAGNOSIS — R197 Diarrhea, unspecified: Secondary | ICD-10-CM | POA: Insufficient documentation

## 2022-03-18 DIAGNOSIS — R82998 Other abnormal findings in urine: Secondary | ICD-10-CM | POA: Insufficient documentation

## 2022-03-18 DIAGNOSIS — Z3A35 35 weeks gestation of pregnancy: Secondary | ICD-10-CM | POA: Diagnosis not present

## 2022-03-18 DIAGNOSIS — O219 Vomiting of pregnancy, unspecified: Secondary | ICD-10-CM | POA: Diagnosis not present

## 2022-03-18 DIAGNOSIS — O212 Late vomiting of pregnancy: Principal | ICD-10-CM | POA: Insufficient documentation

## 2022-03-18 DIAGNOSIS — R109 Unspecified abdominal pain: Secondary | ICD-10-CM

## 2022-03-18 LAB — URINALYSIS, COMPLETE (UACMP) WITH MICROSCOPIC
Bilirubin Urine: NEGATIVE
Glucose, UA: NEGATIVE mg/dL
Hgb urine dipstick: NEGATIVE
Ketones, ur: 80 mg/dL — AB
Nitrite: NEGATIVE
Protein, ur: 30 mg/dL — AB
Specific Gravity, Urine: 1.025 (ref 1.005–1.030)
pH: 6 (ref 5.0–8.0)

## 2022-03-18 MED ORDER — ACETAMINOPHEN 325 MG PO TABS: 650.0000 mg | ORAL_TABLET | ORAL | Status: DC | PRN

## 2022-03-18 MED ORDER — PROMETHAZINE HCL 25 MG RE SUPP: 25.0000 mg | Freq: Four times a day (QID) | RECTAL | Status: DC | PRN

## 2022-03-18 MED ORDER — LACTATED RINGERS IV SOLN: INTRAVENOUS | Status: DC

## 2022-03-18 MED ORDER — PROMETHAZINE HCL 25 MG PO TABS
25.0000 mg | ORAL_TABLET | Freq: Four times a day (QID) | ORAL | Status: DC | PRN
  Administered 2022-03-18: 25 mg via ORAL
  Filled 2022-03-18: qty 1

## 2022-03-18 MED ORDER — ONDANSETRON HCL 4 MG/2ML IJ SOLN
INTRAMUSCULAR | Status: AC
  Filled 2022-03-18: qty 2

## 2022-03-18 MED ORDER — ONDANSETRON HCL 4 MG/2ML IJ SOLN: 4.0000 mg | Freq: Four times a day (QID) | INTRAMUSCULAR | Status: DC | PRN

## 2022-03-18 MED ORDER — SODIUM CHLORIDE 0.9 % IV SOLN
25.0000 mg | Freq: Four times a day (QID) | INTRAVENOUS | Status: DC | PRN
  Filled 2022-03-18: qty 1

## 2022-03-18 MED ORDER — ONDANSETRON HCL 4 MG PO TABS: 4.0000 mg | ORAL_TABLET | Freq: Three times a day (TID) | ORAL | 0 refills | Status: DC | PRN

## 2022-03-18 NOTE — OB Triage Note (Signed)
Bethany Wright is a 31 y.o. G2P1001 at [redacted]w[redacted]d who presents to Labor & Delivery for diarrhea, nausea, and vomiting. She reports that she started feeling unwell on Monday and has been having diarrhea since Tuesday at 0630. She has also vomited and is nauseated. She reports abdominal and back pain. She reports that the last thing she ate was nachos with  meat, but the rest of her family also ate this and are not having any symptoms. She denies any known exposure to illness. She denies contractions, LOF, and vaginal bleeding. She endorses good fetal movement

## 2022-03-18 NOTE — OB Triage Note (Signed)
LABOR & DELIVERY OB TRIAGE NOTE  SUBJECTIVE  HPI Bethany Wright is a 31 y.o. G2P1001 at [redacted]w[redacted]d who presents to Labor & Delivery for diarrhea, nausea, and vomiting. She reports that she started feeling unwell on Monday and has been having diarrhea since Tuesday at 0630. She has also vomited and is nauseated. She reports abdominal and back pain. She reports that the last thing she ate was nachos with  meat, but the rest of her family also ate this and are not having any symptoms. She denies any known exposure to illness. She denies contractions, LOF, and vaginal bleeding. She endorses good fetal movement.  OB History     Gravida  2   Para  1   Term  1   Preterm      AB      Living  1      SAB      IAB      Ectopic      Multiple      Live Births  1           Scheduled Meds:  ondansetron       Continuous Infusions:  lactated ringers     promethazine (PHENERGAN) injection (IM or IVPB)     PRN Meds:.acetaminophen, ondansetron, promethazine **OR** promethazine (PHENERGAN) injection (IM or IVPB) **OR** promethazine  OBJECTIVE  BP 123/83   Pulse 94   Temp 98.6 F (37 C)   LMP 07/12/2021   General: Alert, cooperative, appears uncomfortable Cardiac: RRR, no murmur Lungs: CTAB Abdomen: Soft, gravid, non-tender to palpation, normal bowel sounds  NST I reviewed the NST and it was reactive.  Baseline: 125 Variability: moderate Accelerations: present Decelerations:none Toco: no contractions Category 1  ASSESSMENT Impression  1) Pregnancy at G2P1001, [redacted]w[redacted]d, Estimated Date of Delivery: 04/18/22 2) Reassuring maternal/fetal status 3) Symptoms improved after IV fluids and antiemetics. 4) Urine culture sent  PLAN  1) Discharge home with standard labor/return precautions 2) Encourage to slowly advance diet from clears to bland to regulr 3) Rx for Zofran sent to pharmacy 4) Will f/u on UC results  Keep scheduled ROB appointment  Guadlupe Spanish, CNM

## 2022-03-19 ENCOUNTER — Ambulatory Visit (INDEPENDENT_AMBULATORY_CARE_PROVIDER_SITE_OTHER): Payer: BC Managed Care – PPO | Admitting: Obstetrics

## 2022-03-19 ENCOUNTER — Telehealth: Payer: Self-pay

## 2022-03-19 VITALS — BP 120/80 | Wt 242.0 lb

## 2022-03-19 DIAGNOSIS — O26899 Other specified pregnancy related conditions, unspecified trimester: Secondary | ICD-10-CM

## 2022-03-19 DIAGNOSIS — R519 Headache, unspecified: Secondary | ICD-10-CM

## 2022-03-19 DIAGNOSIS — Z3A35 35 weeks gestation of pregnancy: Secondary | ICD-10-CM

## 2022-03-19 DIAGNOSIS — O26849 Uterine size-date discrepancy, unspecified trimester: Secondary | ICD-10-CM

## 2022-03-19 DIAGNOSIS — O99820 Streptococcus B carrier state complicating pregnancy: Secondary | ICD-10-CM

## 2022-03-19 DIAGNOSIS — Z3480 Encounter for supervision of other normal pregnancy, unspecified trimester: Secondary | ICD-10-CM

## 2022-03-19 DIAGNOSIS — O2600 Excessive weight gain in pregnancy, unspecified trimester: Secondary | ICD-10-CM

## 2022-03-19 LAB — POCT URINALYSIS DIPSTICK OB: Glucose, UA: NEGATIVE

## 2022-03-19 LAB — URINE CULTURE

## 2022-03-19 NOTE — Progress Notes (Addendum)
Subjective: Patient returns for pregnancy follow-up. She has no concerns.  She reports good fetal movement, denies vaginal bleeding or leakage of fluid. She denies contractions. She denies urinary symptoms. Denies headaches, blurred vision or epigastric pain. S/p triage admit for NV, improved with fluids.   Objective: BP 120/80   Wt 242 lb (109.8 kg)   LMP 07/12/2021   BMI 37.90 kg/m  Physical Exam Vitals and nursing note reviewed.  Constitutional:      Appearance: Normal appearance.  HENT:     Head: Normocephalic and atraumatic.  Eyes:     Extraocular Movements: Extraocular movements intact.  Pulmonary:     Effort: Pulmonary effort is normal.  Abdominal:     Palpations: Mass: gravid FH 39.  Musculoskeletal:        General: Normal range of motion.     Cervical back: Normal range of motion.  Skin:    General: Skin is warm and dry.  Neurological:     General: No focal deficit present.     Mental Status: She is alert and oriented to person, place, and time.  Psychiatric:        Mood and Affect: Mood normal.        Behavior: Behavior normal.        Thought Content: Thought content normal.   Assessment and Plan:  Patient Bethany Wright is an 31 y.o. year old G2P1001 at [redacted]w[redacted]d with [facility] of Estimated Date of Delivery: 04/18/22 with [redacted] weeks gestation of pregnancy - Plan: POC Urinalysis Dipstick OB  Pregnancy headache, antepartum  GBS (group B Streptococcus carrier), +RV culture, currently pregnant  Uterine size-date discrepancy, antepartum  Supervision of other normal pregnancy, antepartum - Plan: Urine Culture  Excessive weight gain during pregnancy, antepartum - Plan: Protein / creatinine ratio, urine  Labs: A pos RI VI GBS pos PCN in labor hgb 11.6 Genetics: declined tdap  Immunizations: declined tdap and COVID s/p flu  Its a boy Enzo declines circ Plans breast and pills vs IUD Measures S>D Plan growth [place] Last cx cont rpt urine cx excessive weight gain 58  lbs - now with obesity BMI 37. Plan growth [place]. 10 lb weight gain one week - check PC ratio.   Return in about 1 week (around 03/26/2022) for routine OB, first available growth [place].

## 2022-03-19 NOTE — Progress Notes (Signed)
Pt's appt is 03/26/22 for urine drop off. She cannot make it sooner due to she is going out of town tonight for vacation, returns Monday. 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 She will do her best to get this done by Wednesday.

## 2022-03-19 NOTE — Patient Instructions (Signed)
Please call if you have elevated blood pressure, vision changes, headache, right-sided upper abdominal pain.  Please call if you have contractions occurring every 5-10 minutes for 1-2 hours, if you have vaginal bleeding, if watery fluid is leaking from your vagina, or if you have decreased fetal movement.  If you are not yet signed up on [facility] MyChart, please ask us how to sign up for it!  

## 2022-03-19 NOTE — Telephone Encounter (Signed)
Transition Care Management Unsuccessful Follow-up Telephone Call  Date of discharge and from where:  03/18/2022 from [facility]  Attempts:  1st Attempt  Reason for unsuccessful TCM follow-up call:  Left voice message    

## 2022-03-22 LAB — URINE CULTURE

## 2022-03-24 ENCOUNTER — Telehealth: Payer: Self-pay

## 2022-03-24 NOTE — Telephone Encounter (Signed)
Pt showed up to drop off a urine sample and had question; she is starting to again have vomiting and diarrhea; has been able to keep at least one meal a day down; has not gone 24hrs without keeping liquids down; is concerned b/c she has been drinking Pedialite since Friday.  Adv ok to continue Pedialite and or drink other clear liquids such as sprite, ginger ale, gatorade, 7up; adv hard ginger candy; pt reports no ctxs; adv we don't want her to have more than four ctxs an hour.

## 2022-03-25 LAB — PROTEIN / CREATININE RATIO, URINE
Creatinine, Urine: 217.7 mg/dL
Protein, Ur: 45.7 mg/dL
Protein/Creat Ratio: 210 mg/g creat — ABNORMAL HIGH (ref 0–200)

## 2022-03-26 ENCOUNTER — Encounter: Payer: Self-pay | Admitting: Obstetrics and Gynecology

## 2022-03-26 ENCOUNTER — Other Ambulatory Visit (HOSPITAL_COMMUNITY)
Admission: RE | Admit: 2022-03-26 | Discharge: 2022-03-26 | Disposition: A | Discharge status: Home / Self Care | Payer: BC Managed Care – PPO | Source: Ambulatory Visit | Attending: Obstetrics and Gynecology | Admitting: Obstetrics and Gynecology

## 2022-03-26 ENCOUNTER — Ambulatory Visit (INDEPENDENT_AMBULATORY_CARE_PROVIDER_SITE_OTHER): Payer: BC Managed Care – PPO | Admitting: Obstetrics and Gynecology

## 2022-03-26 VITALS — BP 120/80 | Wt 243.0 lb

## 2022-03-26 DIAGNOSIS — R8271 Bacteriuria: Secondary | ICD-10-CM

## 2022-03-26 DIAGNOSIS — O99210 Obesity complicating pregnancy, unspecified trimester: Secondary | ICD-10-CM

## 2022-03-26 DIAGNOSIS — Z3491 Encounter for supervision of normal pregnancy, unspecified, first trimester: Secondary | ICD-10-CM | POA: Insufficient documentation

## 2022-03-26 DIAGNOSIS — Z419 Encounter for procedure for purposes other than remedying health state, unspecified: Secondary | ICD-10-CM | POA: Diagnosis not present

## 2022-03-26 MED ORDER — PROMETHAZINE HCL 25 MG PO TABS: 25.0000 mg | ORAL_TABLET | Freq: Four times a day (QID) | ORAL | 2 refills | Status: DC | PRN

## 2022-03-26 MED ORDER — SCOPOLAMINE 1 MG/3DAYS TD PT72: 1.0000 | MEDICATED_PATCH | TRANSDERMAL | 12 refills | Status: DC

## 2022-03-26 NOTE — Progress Notes (Signed)
   PRENATAL VISIT NOTE  Subjective:  Bethany Wright is a 31 y.o. G2P1001 at [redacted]w[redacted]d being seen today for ongoing prenatal care.  She is currently monitored for the following issues for this high-risk pregnancy and has Obesity (BMI 35.0-39.9 without comorbidity); Encounter for supervision of low-risk pregnancy in first trimester; Maternal obesity affecting pregnancy, antepartum; Labor and delivery, indication for care; and GBS bacteriuria on their problem list.  Patient reports no complaints.  Contractions: Not present. Vag. Bleeding: None.  Movement: Present. Denies leaking of fluid.   The following portions of the patient's history were reviewed and updated as appropriate: allergies, current medications, past family history, past medical history, past social history, past surgical history and problem list.   Objective:   Vitals:   03/26/22 0840  BP: 120/80  Weight: 243 lb (110.2 kg)    Fetal Status: Fetal Heart Rate (bpm): 135 Fundal Height: 39 cm Movement: Present     General:  Alert, oriented and cooperative. Patient is in no acute distress.  Skin: Skin is warm and dry. No rash noted.   Cardiovascular: Normal heart rate noted  Respiratory: Normal respiratory effort, no problems with respiration noted  Abdomen: Soft, gravid, appropriate for gestational age.  Pain/Pressure: Present     Pelvic: Cervical exam deferred        Extremities: Normal range of motion.     Mental Status: Normal mood and affect. Normal behavior. Normal judgment and thought content.   Assessment and Plan:  Pregnancy: G2P1001 at [redacted]w[redacted]d 1. Encounter for supervision of low-risk pregnancy in first trimester Patient is doing well with persistent intermittent nausea- rx phenergan and scopolamine patch provided Considering pills vs Paraguard IUD  2. Maternal obesity affecting pregnancy, antepartum   3. GBS bacteriuria Prophylaxis in labor  Preterm labor symptoms and general obstetric precautions  including but not limited to vaginal bleeding, contractions, leaking of fluid and fetal movement were reviewed in detail with the patient. Please refer to After Visit Summary for other counseling recommendations.   Return in about 1 week (around 04/02/2022) for in person, ROB, Low risk.  Future Appointments  Date Time Provider [place]  04/02/2022  2:00 PM WS-WS [place] 2 WS-IMG None    Mora Bellman, MD

## 2022-03-27 LAB — CERVICOVAGINAL ANCILLARY ONLY
Chlamydia: NEGATIVE
Comment: NEGATIVE
Comment: NORMAL
Neisseria Gonorrhea: NEGATIVE

## 2022-04-02 ENCOUNTER — Encounter: Payer: Self-pay | Admitting: Obstetrics and Gynecology

## 2022-04-02 ENCOUNTER — Ambulatory Visit (INDEPENDENT_AMBULATORY_CARE_PROVIDER_SITE_OTHER): Payer: BC Managed Care – PPO | Admitting: Obstetrics and Gynecology

## 2022-04-02 ENCOUNTER — Other Ambulatory Visit: Payer: Self-pay | Admitting: Obstetrics

## 2022-04-02 ENCOUNTER — Ambulatory Visit (INDEPENDENT_AMBULATORY_CARE_PROVIDER_SITE_OTHER): Payer: BC Managed Care – PPO

## 2022-04-02 ENCOUNTER — Encounter: Payer: BC Managed Care – PPO | Admitting: Obstetrics

## 2022-04-02 VITALS — BP 120/80 | Wt 246.0 lb

## 2022-04-02 DIAGNOSIS — Z3A37 37 weeks gestation of pregnancy: Secondary | ICD-10-CM | POA: Diagnosis not present

## 2022-04-02 DIAGNOSIS — R8271 Bacteriuria: Secondary | ICD-10-CM

## 2022-04-02 DIAGNOSIS — O99210 Obesity complicating pregnancy, unspecified trimester: Secondary | ICD-10-CM

## 2022-04-02 DIAGNOSIS — Z362 Encounter for other antenatal screening follow-up: Secondary | ICD-10-CM

## 2022-04-02 DIAGNOSIS — Z3491 Encounter for supervision of normal pregnancy, unspecified, first trimester: Secondary | ICD-10-CM

## 2022-04-02 DIAGNOSIS — Z3490 Encounter for supervision of normal pregnancy, unspecified, unspecified trimester: Secondary | ICD-10-CM

## 2022-04-02 LAB — POCT URINALYSIS DIPSTICK OB
Glucose, UA: NEGATIVE
POC,PROTEIN,UA: NEGATIVE

## 2022-04-02 NOTE — Progress Notes (Signed)
PRENATAL VISIT NOTE  Subjective:  Bethany Wright is a 31 y.o. G2P1001 at [redacted]w[redacted]d being seen today for ongoing prenatal care.  She is currently monitored for the following issues for this high-risk pregnancy and has Obesity (BMI 35.0-39.9 without comorbidity); Encounter for supervision of low-risk pregnancy in first trimester; Maternal obesity affecting pregnancy, antepartum; Labor and delivery, indication for care; and GBS bacteriuria on their problem list.  Patient reports no complaints.  Contractions: Not present. Vag. Bleeding: None.  Movement: Present. Denies leaking of fluid.   The following portions of the patient's history were reviewed and updated as appropriate: allergies, current medications, past family history, past medical history, past social history, past surgical history and problem list.   Objective:   Vitals:   04/02/22 1516  BP: 120/80  Weight: 246 lb (111.6 kg)    Fetal Status: Fetal Heart Rate (bpm): 135 Fundal Height: 39 cm Movement: Present  Presentation: Vertex  General:  Alert, oriented and cooperative. Patient is in no acute distress.  Skin: Skin is warm and dry. No rash noted.   Cardiovascular: Normal heart rate noted  Respiratory: Normal respiratory effort, no problems with respiration noted  Abdomen: Soft, gravid, appropriate for gestational age.  Pain/Pressure: Present     Pelvic: Cervical exam performed in the presence of a chaperone Dilation: Closed Effacement (%): Thick Station: Ballotable  Extremities: Normal range of motion.  Edema: None  Mental Status: Normal mood and affect. Normal behavior. Normal judgment and thought content.   Assessment and Plan:  Pregnancy: G2P1001 at [redacted]w[redacted]d 1. Encounter for supervision of low-risk pregnancy in first trimester Patient is doing well without complaints Patient reports resolution of nausea Follow up growth ultrasound report   2. GBS bacteriuria Prophylaxis in labor  3. Maternal obesity affecting  pregnancy, antepartum   Term labor symptoms and general obstetric precautions including but not limited to vaginal bleeding, contractions, leaking of fluid and fetal movement were reviewed in detail with the patient. Please refer to After Visit Summary for other counseling recommendations.   Return in about 1 week (around 04/09/2022) for in person, ROB, Low risk.  Future Appointments  Date Time Provider [place]  04/02/2022  3:35 PM Loa Idler, Vickii Chafe, MD WS-WS None    Mora Bellman, MD

## 2022-04-07 ENCOUNTER — Telehealth: Payer: Self-pay | Admitting: Licensed Practical Nurse

## 2022-04-07 NOTE — Telephone Encounter (Signed)
Patient is calling about the missing last page  #2 of her disability page for [employer] that  needs to be filled out. Patient will resend the page to your email. Please advise?

## 2022-04-09 NOTE — Telephone Encounter (Signed)
Workimng on [employer] today , pt aware

## 2022-04-10 ENCOUNTER — Ambulatory Visit (INDEPENDENT_AMBULATORY_CARE_PROVIDER_SITE_OTHER): Payer: BC Managed Care – PPO | Admitting: Licensed Practical Nurse

## 2022-04-10 ENCOUNTER — Encounter: Payer: Self-pay | Admitting: Licensed Practical Nurse

## 2022-04-10 VITALS — BP 110/80 | Wt 246.0 lb

## 2022-04-10 DIAGNOSIS — Z3480 Encounter for supervision of other normal pregnancy, unspecified trimester: Secondary | ICD-10-CM

## 2022-04-10 DIAGNOSIS — Z3A38 38 weeks gestation of pregnancy: Secondary | ICD-10-CM

## 2022-04-10 NOTE — Progress Notes (Signed)
Routine Prenatal Care Visit  Subjective  Bethany Wright is a 31 y.o. G2P1001 at [redacted]w[redacted]d being seen today for ongoing prenatal care.  She is currently monitored for the following issues for this low-risk pregnancy and has Obesity (BMI 35.0-39.9 without comorbidity); Encounter for supervision of low-risk pregnancy in first trimester; Maternal obesity affecting pregnancy, antepartum; Labor and delivery, indication for care; and GBS bacteriuria on their problem list.  ----------------------------------------------------------------------------------- Patient reports  cramping .  Here with daughter.  Feels period like cramping.  Work is getting to be too difficult, she is tired, achy and swollen at the end of her day, she plans to stop working soon.  Contractions: Irregular.  .  Movement: Present. Leaking Fluid denies.  -recent growth [place] : Impression: 1. [redacted]w[redacted]d Viable Singleton Intrauterine pregnancy previously established criteria. 2. Growth is 63 %ile.  AFI is 12.7 cm.  ----------------------------------------------------------------------------------- The following portions of the patient's history were reviewed and updated as appropriate: allergies, current medications, past family history, past medical history, past social history, past surgical history and problem list. Problem list updated.  Objective  Last menstrual period 07/12/2021. Pregravid weight 184 lb (83.5 kg) Total Weight Gain 62 lb (28.1 kg) Urinalysis: Urine Protein    Urine Glucose    Fetal Status: Fetal Heart Rate (bpm): 140 Fundal Height: 42 cm Movement: Present  Presentation: Vertex  General:  Alert, oriented and cooperative. Patient is in no acute distress.  Skin: Skin is warm and dry. No rash noted.   Cardiovascular: Normal heart rate noted  Respiratory: Normal respiratory effort, no problems with respiration noted  Abdomen: Soft, gravid, appropriate for gestational age. Pain/Pressure: Present     Pelvic:  Cervical  exam performed Dilation: Fingertip Effacement (%): Thick Station: -3  Extremities: Normal range of motion.  Edema: None  Mental Status: Normal mood and affect. Normal behavior. Normal judgment and thought content.   Assessment   31 y.o. G2P1001 at [redacted]w[redacted]d by  04/18/2022, by Last Menstrual Period presenting for routine prenatal visit  Plan   pregnancy2 Problems (from 09/12/21 to present)     Problem Noted Resolved   GBS bacteriuria 03/12/2022 by Catalina Antigua, MD No   Overview Signed 03/12/2022  9:45 AM by Catalina Antigua, MD    Prophylaxis in labor      Encounter for supervision of low-risk pregnancy in first trimester 09/12/2021 by Nadara Mustard, MD No   Overview Addendum 04/02/2022  3:17 PM by Catalina Antigua, MD     Nursing Staff Provider  Office Location  [facility] Dating  LMP  Language  English Anatomy US  normal female  Flu Vaccine   Genetic Screen  NIPS: neg female  TDaP vaccine   declined Hgb A1C or  GTT Third trimester : 93  Covid    LAB RESULTS   Rhogam  NA Blood Type A/Positive/-- (11/18 0902)   Feeding Plan Breast Antibody Negative (11/18 0902)  Contraception Pills vs IUD Rubella 1.48 (11/18 0902)  Circumcision  RPR Non Reactive (11/18 0902)   Pediatrician  same HBsAg Negative (11/18 0902)   Support Person  HIV Non Reactive (11/18 0902)  Prenatal Classes  Varicella immune    GBS  (For PCN allergy, check sensitivities)   BTL Consent     VBAC Consent n/a Pap  9/22 nml    Hgb Electro    Pelvis Tested yes  Term labor symptoms and general obstetric precautions including but not limited to vaginal bleeding, contractions, leaking of fluid and fetal movement were reviewed in detail with the patient. Please refer to After Visit Summary for other counseling recommendations.   Return in about 1 week (around 04/17/2022) for ROB.  Carie Caddy, CNM  Domingo Pulse, [facility]  04/10/22  11:57 AM

## 2022-04-17 ENCOUNTER — Encounter: Payer: Self-pay | Admitting: Obstetrics and Gynecology

## 2022-04-17 ENCOUNTER — Observation Stay (HOSPITAL_BASED_OUTPATIENT_CLINIC_OR_DEPARTMENT_OTHER)
Admission: EM | Admit: 2022-04-17 | Discharge: 2022-04-17 | Disposition: A | Discharge status: Home / Self Care | Payer: BC Managed Care – PPO | Source: Home / Self Care | Admitting: Advanced Practice Midwife

## 2022-04-17 ENCOUNTER — Other Ambulatory Visit: Payer: Self-pay

## 2022-04-17 DIAGNOSIS — R8271 Bacteriuria: Secondary | ICD-10-CM | POA: Insufficient documentation

## 2022-04-17 DIAGNOSIS — O471 False labor at or after 37 completed weeks of gestation: Secondary | ICD-10-CM | POA: Insufficient documentation

## 2022-04-17 DIAGNOSIS — Z79899 Other long term (current) drug therapy: Secondary | ICD-10-CM | POA: Insufficient documentation

## 2022-04-17 DIAGNOSIS — R109 Unspecified abdominal pain: Secondary | ICD-10-CM | POA: Diagnosis not present

## 2022-04-17 DIAGNOSIS — O26893 Other specified pregnancy related conditions, third trimester: Secondary | ICD-10-CM | POA: Diagnosis not present

## 2022-04-17 DIAGNOSIS — O26853 Spotting complicating pregnancy, third trimester: Secondary | ICD-10-CM | POA: Insufficient documentation

## 2022-04-17 DIAGNOSIS — Z3491 Encounter for supervision of normal pregnancy, unspecified, first trimester: Secondary | ICD-10-CM

## 2022-04-17 DIAGNOSIS — Z3A39 39 weeks gestation of pregnancy: Secondary | ICD-10-CM | POA: Insufficient documentation

## 2022-04-17 HISTORY — DX: Other specified health status: Z78.9

## 2022-04-17 NOTE — OB Triage Note (Signed)
Ctx started yeterday continuing throughout the day and night. Worsening around 0400 this am. Denies LOF. Reports + fetal movement. Bethany Wright

## 2022-04-18 ENCOUNTER — Encounter: Payer: Self-pay | Admitting: Obstetrics and Gynecology

## 2022-04-18 ENCOUNTER — Inpatient Hospital Stay
Admission: EM | Admit: 2022-04-18 | Discharge: 2022-04-20 | DRG: 807 | Disposition: A | Discharge status: Home / Self Care | Payer: BC Managed Care – PPO | Attending: Certified Nurse Midwife | Admitting: Certified Nurse Midwife

## 2022-04-18 ENCOUNTER — Other Ambulatory Visit: Payer: Self-pay

## 2022-04-18 DIAGNOSIS — O99824 Streptococcus B carrier state complicating childbirth: Secondary | ICD-10-CM | POA: Diagnosis present

## 2022-04-18 DIAGNOSIS — O2603 Excessive weight gain in pregnancy, third trimester: Secondary | ICD-10-CM | POA: Diagnosis not present

## 2022-04-18 DIAGNOSIS — O99214 Obesity complicating childbirth: Secondary | ICD-10-CM | POA: Diagnosis present

## 2022-04-18 DIAGNOSIS — Z3A40 40 weeks gestation of pregnancy: Secondary | ICD-10-CM

## 2022-04-18 DIAGNOSIS — Z3491 Encounter for supervision of normal pregnancy, unspecified, first trimester: Principal | ICD-10-CM

## 2022-04-18 DIAGNOSIS — R8271 Bacteriuria: Secondary | ICD-10-CM

## 2022-04-18 DIAGNOSIS — O480 Post-term pregnancy: Secondary | ICD-10-CM | POA: Diagnosis not present

## 2022-04-18 DIAGNOSIS — O99820 Streptococcus B carrier state complicating pregnancy: Secondary | ICD-10-CM | POA: Diagnosis not present

## 2022-04-18 MED ORDER — COCONUT OIL OIL
1.0000 | TOPICAL_OIL | Status: DC | PRN
  Filled 2022-04-18: qty 120

## 2022-04-18 MED ORDER — ONDANSETRON HCL 4 MG/2ML IJ SOLN: 4.0000 mg | Freq: Four times a day (QID) | INTRAMUSCULAR | Status: DC | PRN

## 2022-04-18 MED ORDER — LIDOCAINE HCL (PF) 1 % IJ SOLN
30.0000 mL | INTRAMUSCULAR | Status: DC | PRN
  Filled 2022-04-18: qty 30

## 2022-04-18 MED ORDER — HYDROXYZINE HCL 25 MG PO TABS: 50.0000 mg | ORAL_TABLET | Freq: Four times a day (QID) | ORAL | Status: DC | PRN

## 2022-04-18 MED ORDER — LACTATED RINGERS IV SOLN: INTRAVENOUS | Status: DC

## 2022-04-18 MED ORDER — SODIUM CHLORIDE 0.9 % IV SOLN
2.0000 g | Freq: Once | INTRAVENOUS | Status: AC
  Administered 2022-04-19: 2 g via INTRAVENOUS
  Filled 2022-04-18: qty 2000

## 2022-04-18 MED ORDER — FENTANYL CITRATE (PF) 100 MCG/2ML IJ SOLN: 50.0000 ug | INTRAMUSCULAR | Status: DC | PRN

## 2022-04-18 MED ORDER — OXYTOCIN-SODIUM CHLORIDE 30-0.9 UT/500ML-% IV SOLN
2.5000 [IU]/h | INTRAVENOUS | Status: DC
  Filled 2022-04-18: qty 500

## 2022-04-18 MED ORDER — OXYTOCIN BOLUS FROM INFUSION
333.0000 mL | Freq: Once | INTRAVENOUS | Status: AC
  Administered 2022-04-19: 333 mL via INTRAVENOUS

## 2022-04-18 MED ORDER — SOD CITRATE-CITRIC ACID 500-334 MG/5ML PO SOLN: 30.0000 mL | ORAL | Status: DC | PRN

## 2022-04-18 MED ORDER — ACETAMINOPHEN 325 MG PO TABS: 650.0000 mg | ORAL_TABLET | ORAL | Status: DC | PRN

## 2022-04-18 MED ORDER — SODIUM CHLORIDE 0.9 % IV SOLN
1.0000 g | INTRAVENOUS | Status: DC
  Administered 2022-04-19 (×2): 1 g via INTRAVENOUS
  Filled 2022-04-18 (×2): qty 1000

## 2022-04-18 MED ORDER — LACTATED RINGERS IV SOLN
500.0000 mL | INTRAVENOUS | Status: DC | PRN
  Administered 2022-04-19: 500 mL via INTRAVENOUS

## 2022-04-19 ENCOUNTER — Encounter: Payer: Self-pay | Admitting: Obstetrics

## 2022-04-19 ENCOUNTER — Inpatient Hospital Stay: Payer: BC Managed Care – PPO | Admitting: Anesthesiology

## 2022-04-19 DIAGNOSIS — O480 Post-term pregnancy: Secondary | ICD-10-CM

## 2022-04-19 DIAGNOSIS — O99820 Streptococcus B carrier state complicating pregnancy: Secondary | ICD-10-CM

## 2022-04-19 DIAGNOSIS — O2603 Excessive weight gain in pregnancy, third trimester: Secondary | ICD-10-CM

## 2022-04-19 DIAGNOSIS — Z3A40 40 weeks gestation of pregnancy: Secondary | ICD-10-CM

## 2022-04-19 LAB — CBC
HCT: 36.8 % (ref 36.0–46.0)
Hemoglobin: 11.6 g/dL — ABNORMAL LOW (ref 12.0–15.0)
MCH: 24.5 pg — ABNORMAL LOW (ref 26.0–34.0)
MCHC: 31.5 g/dL (ref 30.0–36.0)
MCV: 77.6 fL — ABNORMAL LOW (ref 80.0–100.0)
Platelets: 395 10*3/uL (ref 150–400)
RBC: 4.74 MIL/uL (ref 3.87–5.11)
RDW: 16 % — ABNORMAL HIGH (ref 11.5–15.5)
WBC: 11.5 10*3/uL — ABNORMAL HIGH (ref 4.0–10.5)
nRBC: 0 % (ref 0.0–0.2)

## 2022-04-19 LAB — ABO/RH: ABO/RH(D): A POS

## 2022-04-19 LAB — RPR: RPR Ser Ql: NONREACTIVE

## 2022-04-19 LAB — TYPE AND SCREEN
ABO/RH(D): A POS
Antibody Screen: NEGATIVE

## 2022-04-19 MED ORDER — SIMETHICONE 80 MG PO CHEW: 80.0000 mg | CHEWABLE_TABLET | ORAL | Status: DC | PRN

## 2022-04-19 MED ORDER — FENTANYL-BUPIVACAINE-NACL 0.5-0.125-0.9 MG/250ML-% EP SOLN
12.0000 mL/h | EPIDURAL | Status: DC | PRN
  Administered 2022-04-19: 12 mL/h via EPIDURAL
  Filled 2022-04-19: qty 250

## 2022-04-19 MED ORDER — PHENYLEPHRINE 80 MCG/ML (10ML) SYRINGE FOR IV PUSH (FOR BLOOD PRESSURE SUPPORT)
80.0000 ug | PREFILLED_SYRINGE | INTRAVENOUS | Status: DC | PRN
  Filled 2022-04-19 (×2): qty 10

## 2022-04-19 MED ORDER — DIPHENHYDRAMINE HCL 25 MG PO CAPS: 25.0000 mg | ORAL_CAPSULE | Freq: Four times a day (QID) | ORAL | Status: DC | PRN

## 2022-04-19 MED ORDER — PHENYLEPHRINE 80 MCG/ML (10ML) SYRINGE FOR IV PUSH (FOR BLOOD PRESSURE SUPPORT)
80.0000 ug | PREFILLED_SYRINGE | INTRAVENOUS | Status: DC | PRN
  Administered 2022-04-19: 160 ug via INTRAVENOUS

## 2022-04-19 MED ORDER — IBUPROFEN 600 MG PO TABS
600.0000 mg | ORAL_TABLET | Freq: Four times a day (QID) | ORAL | Status: DC
  Administered 2022-04-19 – 2022-04-20 (×5): 600 mg via ORAL
  Filled 2022-04-19 (×5): qty 1

## 2022-04-19 MED ORDER — BENZOCAINE-MENTHOL 20-0.5 % EX AERO
1.0000 | INHALATION_SPRAY | CUTANEOUS | Status: DC | PRN
  Filled 2022-04-19: qty 56

## 2022-04-19 MED ORDER — WITCH HAZEL-GLYCERIN EX PADS
1.0000 | MEDICATED_PAD | CUTANEOUS | Status: DC | PRN
  Filled 2022-04-19: qty 100

## 2022-04-19 MED ORDER — ONDANSETRON HCL 4 MG/2ML IJ SOLN: 4.0000 mg | INTRAMUSCULAR | Status: DC | PRN

## 2022-04-19 MED ORDER — ZOLPIDEM TARTRATE 5 MG PO TABS: 5.0000 mg | ORAL_TABLET | Freq: Every evening | ORAL | Status: DC | PRN

## 2022-04-19 MED ORDER — AMMONIA AROMATIC IN INHA
RESPIRATORY_TRACT | Status: DC
Start: 2022-04-19 — End: 2022-04-19
  Filled 2022-04-19: qty 10

## 2022-04-19 MED ORDER — OXYTOCIN 10 UNIT/ML IJ SOLN
INTRAMUSCULAR | Status: AC
  Filled 2022-04-19: qty 2

## 2022-04-19 MED ORDER — LIDOCAINE HCL (PF) 1 % IJ SOLN
INTRAMUSCULAR | Status: DC | PRN
  Administered 2022-04-19: 3 mL via SUBCUTANEOUS

## 2022-04-19 MED ORDER — LIDOCAINE-EPINEPHRINE (PF) 1.5 %-1:200000 IJ SOLN
INTRAMUSCULAR | Status: DC | PRN
  Administered 2022-04-19: 3 mL via EPIDURAL

## 2022-04-19 MED ORDER — MISOPROSTOL 200 MCG PO TABS
ORAL_TABLET | ORAL | Status: DC
Start: 2022-04-19 — End: 2022-04-19
  Filled 2022-04-19: qty 4

## 2022-04-19 MED ORDER — DOCUSATE SODIUM 100 MG PO CAPS
100.0000 mg | ORAL_CAPSULE | Freq: Two times a day (BID) | ORAL | Status: DC
  Administered 2022-04-19 – 2022-04-20 (×2): 100 mg via ORAL
  Filled 2022-04-19 (×2): qty 1

## 2022-04-19 MED ORDER — DIPHENHYDRAMINE HCL 50 MG/ML IJ SOLN: 12.5000 mg | INTRAMUSCULAR | Status: DC | PRN

## 2022-04-19 MED ORDER — DIBUCAINE (PERIANAL) 1 % EX OINT
1.0000 | TOPICAL_OINTMENT | CUTANEOUS | Status: DC | PRN
Start: 2022-04-19 — End: 2022-04-20
  Filled 2022-04-19: qty 28

## 2022-04-19 MED ORDER — EPHEDRINE 5 MG/ML INJ
10.0000 mg | INTRAVENOUS | Status: DC | PRN
  Administered 2022-04-19: 10 mg via INTRAVENOUS

## 2022-04-19 MED ORDER — EPHEDRINE 5 MG/ML INJ
10.0000 mg | INTRAVENOUS | Status: DC | PRN
  Filled 2022-04-19: qty 4

## 2022-04-19 MED ORDER — SODIUM CHLORIDE 0.9 % IV SOLN
INTRAVENOUS | Status: DC | PRN
  Administered 2022-04-19 (×2): 5 mL via EPIDURAL

## 2022-04-19 MED ORDER — ONDANSETRON HCL 4 MG PO TABS: 4.0000 mg | ORAL_TABLET | ORAL | Status: DC | PRN

## 2022-04-19 MED ORDER — PRENATAL MULTIVITAMIN CH: 1.0000 | ORAL_TABLET | Freq: Every day | ORAL | Status: DC

## 2022-04-19 MED ORDER — OXYTOCIN-SODIUM CHLORIDE 30-0.9 UT/500ML-% IV SOLN
1.0000 m[IU]/min | INTRAVENOUS | Status: DC
  Administered 2022-04-19: 2 m[IU]/min via INTRAVENOUS

## 2022-04-19 MED ORDER — LACTATED RINGERS IV SOLN
500.0000 mL | Freq: Once | INTRAVENOUS | Status: AC
Start: 2022-04-19 — End: 2022-04-19
  Administered 2022-04-19: 500 mL via INTRAVENOUS

## 2022-04-19 MED ORDER — ACETAMINOPHEN 500 MG PO TABS
1000.0000 mg | ORAL_TABLET | Freq: Four times a day (QID) | ORAL | Status: DC
  Administered 2022-04-19 – 2022-04-20 (×4): 1000 mg via ORAL
  Filled 2022-04-19 (×5): qty 2

## 2022-04-19 NOTE — Anesthesia Procedure Notes (Signed)
Epidural Patient location during procedure: OB Start time: 04/19/2022 1:05 AM End time: 04/19/2022 1:12 AM  Staffing Anesthesiologist: Lenard Simmer, MD Performed: anesthesiologist   Preanesthetic Checklist Completed: patient identified, IV checked, site marked, risks and benefits discussed, surgical consent, monitors and equipment checked, pre-op evaluation and timeout performed  Epidural Patient position: sitting Prep: ChloraPrep Patient monitoring: heart rate, continuous pulse ox and blood pressure Approach: midline Location: L3-L4 Injection technique: LOR saline  Needle:  Needle type: Tuohy  Needle gauge: 17 G Needle length: 9 cm and 9 Needle insertion depth: 7 cm Catheter type: closed end flexible Catheter size: 19 Gauge Catheter at skin depth: 12 cm Test dose: negative and 1.5% lidocaine with Epi 1:200 K  Assessment Sensory level: T10 Events: blood not aspirated, injection not painful, no injection resistance, no paresthesia and negative IV test  Additional Notes 1st attempt Pt. Evaluated and documentation done after procedure finished. Patient identified. Risks/Benefits/Options discussed with patient including but not limited to bleeding, infection, nerve damage, paralysis, failed block, incomplete pain control, headache, blood pressure changes, nausea, vomiting, reactions to medication both or allergic, itching and postpartum back pain. Confirmed with bedside nurse the patient's most recent platelet count. Confirmed with patient that they are not currently taking any anticoagulation, have any bleeding history or any family history of bleeding disorders. Patient expressed understanding and wished to proceed. All questions were answered. Sterile technique was used throughout the entire procedure. Please see nursing notes for vital signs. Test dose was given through epidural catheter and negative prior to continuing to dose epidural or start infusion. Warning signs of high  block given to the patient including shortness of breath, tingling/numbness in hands, complete motor block, or any concerning symptoms with instructions to call for help. Patient was given instructions on fall risk and not to get out of bed. All questions and concerns addressed with instructions to call with any issues or inadequate analgesia.    Patient tolerated the insertion well without immediate complications.Reason for block:procedure for pain

## 2022-04-19 NOTE — Discharge Summary (Signed)
Obstetrical Discharge Summary  Date of Admission: 04/18/2022 Date of Discharge: 04/20/2022  Primary OB: [facility]  Gestational Age at Delivery: [redacted]w[redacted]d   Antepartum complications: excessive weight gain in pregnancy, BMI over 35, GBS positive  Reason for Admission: uterine contractions Date of Delivery: 04/19/2022  Delivered By: Siri Cole, CNM  Delivery Type: spontaneous vaginal delivery Intrapartum complications/course: None Anesthesia: epidural Placenta: Delivered and expressed via active management. Intact: yes. To pathology: no.  Laceration: labial Episiotomy: none EBL: Baby: Liveborn female, APGARs 8/9,    Discharge Diagnosis: Delivered.   Postpartum course: normal  Discharge Vital Signs:  Current Vital Signs 24h Vital Sign Ranges  T 98.1 F (36.7 C) Temp  Avg: 98 F (36.7 C)  Min: 97.7 F (36.5 C)  Max: 98.2 F (36.8 C)  BP 112/71 BP  Min: 86/51  Max: 130/72  HR 80 Pulse  Avg: 82.1  Min: 68  Max: 120  RR 20 Resp  Avg: 16.8  Min: 14  Max: 20  SaO2 99 % Room Air SpO2  Avg: 97.6 %  Min: 97 %  Max: 100 %       24 Hour I/O Current Shift I/O  Time Ins Outs 06/25 0701 - 06/26 0700 In: 2222.1 [I.V.:2222.1] Out: 3090 [Urine:2800] No intake/output data recorded.     Patient Vitals for the past 6 hrs:  BP Temp Temp src Pulse SpO2  04/20/22 0528 112/71 98.1 F (36.7 C) Oral 80 99 %    Discharge Exam:  NAD Perineum: minimal swelling , labia laceration healing Abdomen: firm fundus below the umbilicus, NTTP, non distended, +bowel sounds.   RRR no MRGs CTAB Ext: no c/c/e  Recent Labs  Lab 04/19/22 0004 04/20/22 0516  WBC 11.5* 10.4  HGB 11.6* 9.6*  HCT 36.8 30.6*  PLT 395 317    Disposition: Home  Rh Immune globulin given: no Rubella vaccine given: no Tdap vaccine given in AP or PP setting: declined Flu vaccine given in AP or PP setting: yes  Contraception:  POP's   Prenatal/Postnatal Panel: A POS Performed at [facility], [address] [place], [place] [address] [place] Holter Immune//Varicella Immune//RPR negative//HIV negative/HepB Surface Ag negative//pap no abnormalities (date: 06/2021)//plans to breastfeed  Plan:  Bethany Wright was discharged to home in good condition. Follow-up appointment with LMD in 2wk (PRN) 6 weeks for a PP visit  No future appointments.   Discharge Medications:  Ibuprofen prn Colace Progestin only pill @ 4 weeks postpartum.  Continue PNV  Doreene Burke, CNM  9:14 AM

## 2022-04-20 ENCOUNTER — Encounter: Payer: BC Managed Care – PPO | Admitting: Family Medicine

## 2022-04-20 LAB — CBC
HCT: 30.6 % — ABNORMAL LOW (ref 36.0–46.0)
Hemoglobin: 9.6 g/dL — ABNORMAL LOW (ref 12.0–15.0)
MCH: 24.9 pg — ABNORMAL LOW (ref 26.0–34.0)
MCHC: 31.4 g/dL (ref 30.0–36.0)
MCV: 79.3 fL — ABNORMAL LOW (ref 80.0–100.0)
Platelets: 317 10*3/uL (ref 150–400)
RBC: 3.86 MIL/uL — ABNORMAL LOW (ref 3.87–5.11)
RDW: 16 % — ABNORMAL HIGH (ref 11.5–15.5)
WBC: 10.4 10*3/uL (ref 4.0–10.5)
nRBC: 0 % (ref 0.0–0.2)

## 2022-04-20 MED ORDER — NORETHINDRONE 0.35 MG PO TABS: 1.0000 | ORAL_TABLET | Freq: Every day | ORAL | 11 refills | Status: DC

## 2022-04-20 MED ORDER — DOCUSATE SODIUM 100 MG PO CAPS: 100.0000 mg | ORAL_CAPSULE | Freq: Two times a day (BID) | ORAL | 0 refills | Status: DC

## 2022-04-20 MED ORDER — IBUPROFEN 600 MG PO TABS: 600.0000 mg | ORAL_TABLET | Freq: Four times a day (QID) | ORAL | 0 refills | Status: DC

## 2022-04-20 NOTE — Progress Notes (Signed)
Reviewed D/C instructions with pt and family. Pt verbalized understanding of teaching. Discharged to home via W/C. Pt to schedule f/u appt.  

## 2022-04-20 NOTE — Final Progress Note (Signed)
Final Progress Note  Patient ID: Bethany Wright MRN: [id number] DOB/AGE: [birth year] 31 y.o.  Admit date: 04/18/2022 Admitting provider: Glenetta Borg, CNM Discharge date: 04/20/2022   Admission Diagnoses: Term pregnancy   Discharge Diagnoses:  Principal Problem:   Labor and delivery, indication for care    Consults: None  Significant Findings/ Diagnostic Studies:  none  Procedures: Epidural   Discharge Condition: good  Disposition: Discharge disposition: 01-Home or Self Care       Diet: Regular diet  Discharge Activity: Activity as tolerated and no driving for today, Ambulate in house, and No lifting more than the baby, or strenuous exercise for 4 to 6 weeks.    Allergies as of 04/20/2022   No Known Allergies      Medication List     TAKE these medications    docusate sodium 100 MG capsule Commonly known as: COLACE Take 1 capsule (100 mg total) by mouth 2 (two) times daily.   ibuprofen 600 MG tablet Commonly known as: ADVIL Take 1 tablet (600 mg total) by mouth every 6 (six) hours.   multivitamin-prenatal 27-0.8 MG Tabs tablet Take 1 tablet by mouth daily at 12 noon.   norethindrone 0.35 MG tablet Commonly known as: MICRONOR Take 1 tablet (0.35 mg total) by mouth daily. Start 4 weeks postpartum        Follow-up Information     Dominic, Courtney Heys, CNM Follow up in 6 week(s).   Specialty: Obstetrics and Gynecology Contact information: [address] [place] [address] [phone]                 Total time spent taking care of this patient: 15 minutes  Signed: Doreene Burke 04/20/2022, 9:17 AM

## 2022-04-25 DIAGNOSIS — Z419 Encounter for procedure for purposes other than remedying health state, unspecified: Secondary | ICD-10-CM | POA: Diagnosis not present

## 2022-05-05 ENCOUNTER — Ambulatory Visit (INDEPENDENT_AMBULATORY_CARE_PROVIDER_SITE_OTHER): Payer: BC Managed Care – PPO | Admitting: Advanced Practice Midwife

## 2022-05-05 ENCOUNTER — Encounter: Payer: Self-pay | Admitting: Advanced Practice Midwife

## 2022-05-05 NOTE — Progress Notes (Signed)
Patient ID: Bethany Wright, female   DOB: 03-21-1991, 31 y.o.   MRN: [id number]  Reason for Consult: Postpartum Care   Subjective:  HPI:  Bethany Wright is a 31 y.o. female 2 weeks postpartum. She is doing well and has no concerns. She reports breastfeeding is going well. She still has some light stopping/starting bleeding. She has a prescription for POPs and will start at 4 weeks postpartum.   Past Medical History:  Diagnosis Date   Medical history non-contributory    Family History  Problem Relation Age of Onset   Hypertension Mother    Past Surgical History:  Procedure Laterality Date   NO PAST SURGERIES      Short Social History:  Social History   Tobacco Use   Smoking status: Never   Smokeless tobacco: Never  Substance Use Topics   Alcohol use: No    No Known Allergies  Current Outpatient Medications  Medication Sig Dispense Refill   docusate sodium (COLACE) 100 MG capsule Take 1 capsule (100 mg total) by mouth 2 (two) times daily. 10 capsule 0   Prenatal Vit-Fe Fumarate-FA (MULTIVITAMIN-PRENATAL) 27-0.8 MG TABS tablet Take 1 tablet by mouth daily at 12 noon.     ibuprofen (ADVIL) 600 MG tablet Take 1 tablet (600 mg total) by mouth every 6 (six) hours. (Patient not taking: Reported on 05/05/2022) 30 tablet 0   norethindrone (MICRONOR) 0.35 MG tablet Take 1 tablet (0.35 mg total) by mouth daily. Start 4 weeks postpartum (Patient not taking: Reported on 05/05/2022) 30 tablet 11   No current facility-administered medications for this visit.    Review of Systems  Constitutional:  Negative for chills and fever.  HENT:  Negative for congestion, ear discharge, ear pain, hearing loss, sinus pain and sore throat.   Eyes:  Negative for blurred vision and double vision.  Respiratory:  Negative for cough, shortness of breath and wheezing.   Cardiovascular:  Negative for chest pain, palpitations and leg swelling.  Gastrointestinal:  Negative for abdominal  pain, blood in stool, constipation, diarrhea, heartburn, melena, nausea and vomiting.  Genitourinary:  Negative for dysuria, flank pain, frequency, hematuria and urgency.  Musculoskeletal:  Negative for back pain, joint pain and myalgias.  Skin:  Negative for itching and rash.  Neurological:  Negative for dizziness, tingling, tremors, sensory change, speech change, focal weakness, seizures, loss of consciousness, weakness and headaches.  Endo/Heme/Allergies:  Negative for environmental allergies. Does not bruise/bleed easily.  Psychiatric/Behavioral:  Negative for depression, hallucinations, memory loss, substance abuse and suicidal ideas. The patient is not nervous/anxious and does not have insomnia.         Objective:  Objective   Vitals:   05/05/22 1401  BP: 120/80  Weight: 228 lb (103.4 kg)  Height: 5' 7.5" (1.715 m)   Body mass index is 35.18 kg/m. Constitutional: Well nourished, well developed female in no acute distress.  HEENT: normal Skin: Warm and dry.  Extremity:  no edema   Respiratory:  Normal respiratory effort Neuro: DTRs 2+, Cranial nerves grossly intact Psych: Alert and Oriented x3. No memory deficits. Normal mood and affect.   Data:  Edinburgh Postnatal Depression Scale - 05/05/22 1403       Edinburgh Postnatal Depression Scale:  In the Past 7 Days   I have been able to laugh and see the funny side of things. 0    I have looked forward with enjoyment to things. 0    I have blamed myself unnecessarily when things  went wrong. 1    I have been anxious or worried for no good reason. 0    I have felt scared or panicky for no good reason. 0    Things have been getting on top of me. 1    I have been so unhappy that I have had difficulty sleeping. 0    I have felt sad or miserable. 0    I have been so unhappy that I have been crying. 0    The thought of harming myself has occurred to me. 0    Edinburgh Postnatal Depression Scale Total 2                  Assessment/Plan:     31 y.o. G2 P51 female doing well 2 weeks postpartum  Return in 4 weeks for 6 week postpartum visit   Bethany Wright CNM [facility] 05/05/2022, 2:33 PM

## 2022-05-26 DIAGNOSIS — Z419 Encounter for procedure for purposes other than remedying health state, unspecified: Secondary | ICD-10-CM | POA: Diagnosis not present

## 2022-06-03 ENCOUNTER — Ambulatory Visit (INDEPENDENT_AMBULATORY_CARE_PROVIDER_SITE_OTHER): Payer: BC Managed Care – PPO | Admitting: Licensed Practical Nurse

## 2022-06-03 ENCOUNTER — Encounter: Payer: Self-pay | Admitting: Licensed Practical Nurse

## 2022-06-03 DIAGNOSIS — Z862 Personal history of diseases of the blood and blood-forming organs and certain disorders involving the immune mechanism: Secondary | ICD-10-CM

## 2022-06-03 NOTE — Progress Notes (Signed)
  Postpartum Visit  Chief Complaint:  Chief Complaint  Patient presents with  . Postpartum Care    No concerns    History of Present Illness: Patient is a 31 y.o. [id number] presents for postpartum visit.  Date of delivery: *** Type of delivery: Vaginal delivery - Vacuum or forceps assisted  {yes/no:63} Episiotomy No.  Laceration: {yes/no:63}  Pregnancy or labor problems:  {yes/no:63} Any problems since the delivery:  {yes/no:63}  Newborn Details:  SINGLETON :  1. Baby's name: ***. Birth weight: *** Maternal Details:  Breast Feeding:  {yes/no:63} Post partum depression/anxiety noted:  {yes/no:63} Edinburgh Post-Partum Depression Score:  {NUMBERS:20191}  Date of last PAP: ***  {norm/abn:16337}   Past Medical History:  Diagnosis Date  . Medical history non-contributory     Past Surgical History:  Procedure Laterality Date  . NO PAST SURGERIES      Prior to Admission medications   Medication Sig Start Date End Date Taking? Authorizing Provider  Prenatal Vit-Fe Fumarate-FA (MULTIVITAMIN-PRENATAL) 27-0.8 MG TABS tablet Take 1 tablet by mouth daily at 12 noon.   Yes [provider]  norethindrone (MICRONOR) 0.35 MG tablet Take 1 tablet (0.35 mg total) by mouth daily. Start 4 weeks postpartum Patient not taking: Reported on 05/05/2022 04/20/22   Doreene Burke, CNM    No Known Allergies   Social History   Socioeconomic History  . Marital status: Single    Spouse name: Not on file  . Number of children: Not on file  . Years of education: Not on file  . Highest education level: Not on file  Occupational History  . Not on file  Tobacco Use  . Smoking status: Never  . Smokeless tobacco: Never  Vaping Use  . Vaping Use: Never used  Substance and Sexual Activity  . Alcohol use: No  . Drug use: No  . Sexual activity: Not Currently    Birth control/protection: None  Other Topics Concern  . Not on file  Social History Narrative  . Not on file   Social  Determinants of Health   Financial Resource Strain: Not on file  Food Insecurity: Not on file  Transportation Needs: Not on file  Physical Activity: Not on file  Stress: Not on file  Social Connections: Not on file  Intimate Partner Violence: Not on file    Family History  Problem Relation Age of Onset  . Hypertension Mother     ROS   Physical Exam BP 116/84   Ht 5' 7.5" (1.715 m)   Wt 229 lb (103.9 kg)   Breastfeeding Yes   BMI 35.34 kg/m   OBGyn Exam   Female Chaperone present during breast and/or pelvic exam.  Assessment: 31 y.o. [id number] presenting for 6 week postpartum visit  Plan: Problem List Items Addressed This Visit   None Visit Diagnoses     History of anemia    -  Primary   Relevant Orders   CBC        1) Contraception Education given regarding options for contraception, including {contraceptive options (MU measure 33):20677}.  2)  Pap - ASCCP guidelines and rational discussed.  Patient opts for *** screening interval  3) Patient underwent screening for postpartum depression with *** concerns noted.  4) Follow up 1 year for routine annual exam  Thomasene Mohair, MD 06/03/2022 10:50 AM

## 2022-06-04 LAB — CBC
Hematocrit: 39.3 % (ref 34.0–46.6)
Hemoglobin: 12.6 g/dL (ref 11.1–15.9)
MCH: 25 pg — ABNORMAL LOW (ref 26.6–33.0)
MCHC: 32.1 g/dL (ref 31.5–35.7)
MCV: 78 fL — ABNORMAL LOW (ref 79–97)
Platelets: 371 10*3/uL (ref 150–450)
RBC: 5.04 x10E6/uL (ref 3.77–5.28)
RDW: 18 % — ABNORMAL HIGH (ref 11.7–15.4)
WBC: 6.6 10*3/uL (ref 3.4–10.8)

## 2022-06-26 DIAGNOSIS — Z419 Encounter for procedure for purposes other than remedying health state, unspecified: Secondary | ICD-10-CM | POA: Diagnosis not present

## 2022-07-26 DIAGNOSIS — Z419 Encounter for procedure for purposes other than remedying health state, unspecified: Secondary | ICD-10-CM | POA: Diagnosis not present

## 2022-08-26 DIAGNOSIS — Z419 Encounter for procedure for purposes other than remedying health state, unspecified: Secondary | ICD-10-CM | POA: Diagnosis not present

## 2022-09-25 DIAGNOSIS — Z419 Encounter for procedure for purposes other than remedying health state, unspecified: Secondary | ICD-10-CM | POA: Diagnosis not present

## 2022-10-26 DIAGNOSIS — Z419 Encounter for procedure for purposes other than remedying health state, unspecified: Secondary | ICD-10-CM | POA: Diagnosis not present

## 2022-11-26 DIAGNOSIS — Z419 Encounter for procedure for purposes other than remedying health state, unspecified: Secondary | ICD-10-CM | POA: Diagnosis not present

## 2022-12-25 DIAGNOSIS — Z419 Encounter for procedure for purposes other than remedying health state, unspecified: Secondary | ICD-10-CM | POA: Diagnosis not present

## 2023-01-25 DIAGNOSIS — Z419 Encounter for procedure for purposes other than remedying health state, unspecified: Secondary | ICD-10-CM | POA: Diagnosis not present

## 2023-02-24 DIAGNOSIS — Z419 Encounter for procedure for purposes other than remedying health state, unspecified: Secondary | ICD-10-CM | POA: Diagnosis not present

## 2023-03-27 DIAGNOSIS — Z419 Encounter for procedure for purposes other than remedying health state, unspecified: Secondary | ICD-10-CM | POA: Diagnosis not present

## 2023-04-26 DIAGNOSIS — Z419 Encounter for procedure for purposes other than remedying health state, unspecified: Secondary | ICD-10-CM | POA: Diagnosis not present

## 2023-05-23 DIAGNOSIS — Z6834 Body mass index (BMI) 34.0-34.9, adult: Secondary | ICD-10-CM | POA: Diagnosis not present

## 2023-05-23 DIAGNOSIS — Z20822 Contact with and (suspected) exposure to covid-19: Secondary | ICD-10-CM | POA: Diagnosis not present

## 2023-05-23 DIAGNOSIS — Z1152 Encounter for screening for COVID-19: Secondary | ICD-10-CM | POA: Diagnosis not present

## 2023-05-23 DIAGNOSIS — U071 COVID-19: Secondary | ICD-10-CM | POA: Diagnosis not present

## 2023-05-23 DIAGNOSIS — J029 Acute pharyngitis, unspecified: Secondary | ICD-10-CM | POA: Diagnosis not present

## 2023-05-27 DIAGNOSIS — Z419 Encounter for procedure for purposes other than remedying health state, unspecified: Secondary | ICD-10-CM | POA: Diagnosis not present

## 2023-06-18 IMAGING — US US OB < 14 WEEKS - US OB TV
1 series · 14 of 28 positions shown · non-contrast
Comparison: None.

CLINICAL DATA: Dating

EXAM:
OBSTETRIC <14 WK ULTRASOUND
TECHNIQUE: Transabdominal ultrasound was performed for evaluation of the
gestation as well as the maternal uterus and adnexal regions.

[Series 1: us ob < 14 weeks - us ob tv · 0.14mm/px · 14 of 46 slices shown]
[im 2/46]
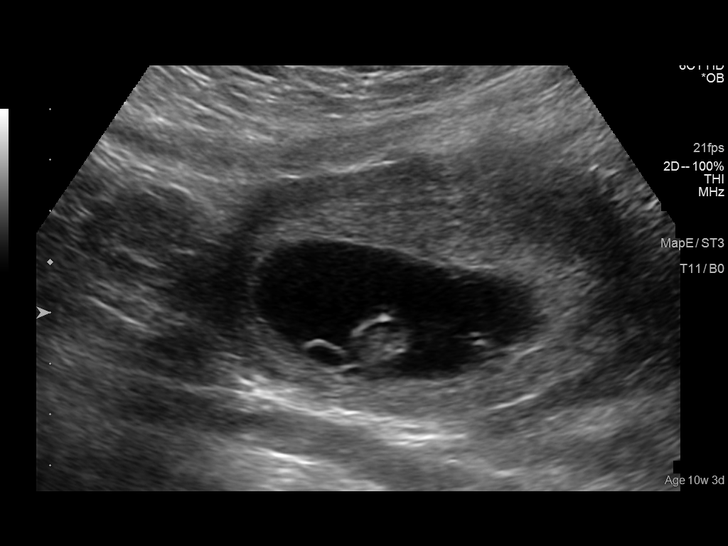
[im 6/46]
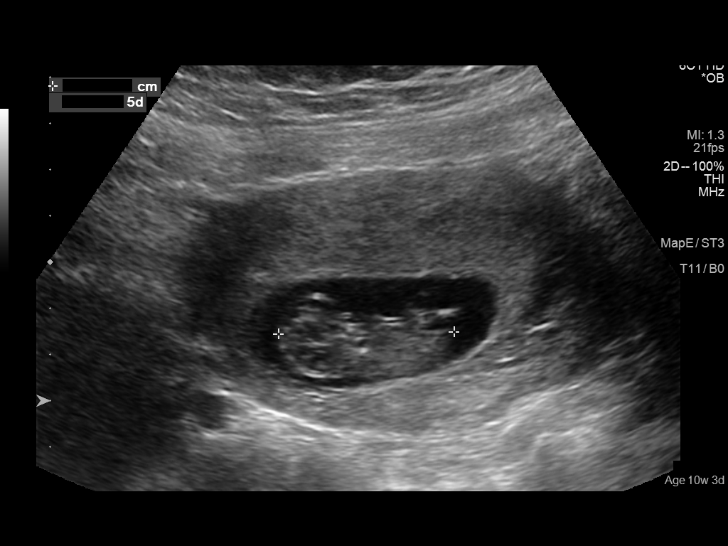
[im 9/46]
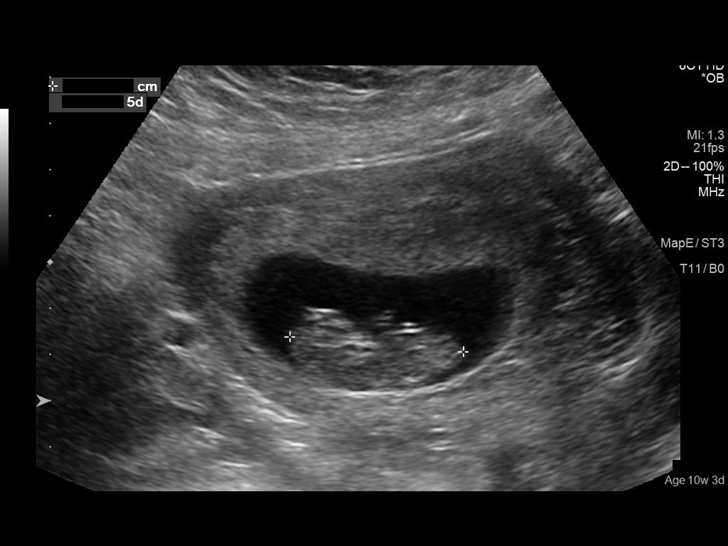
[im 12/46]
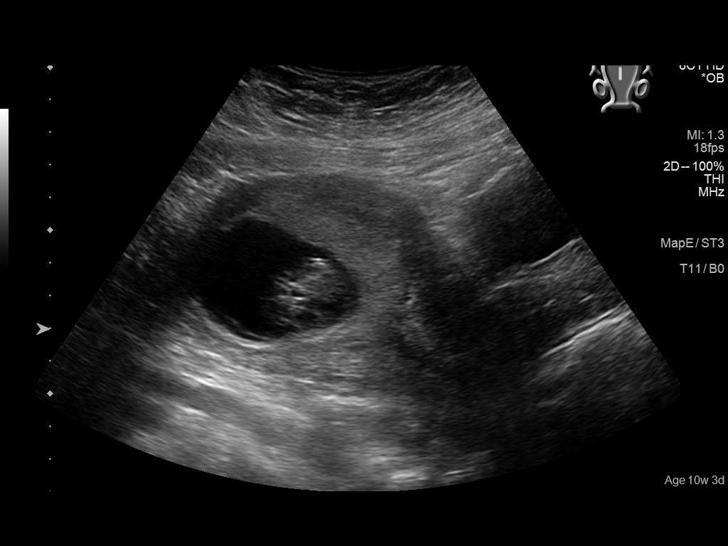
[im 16/46]
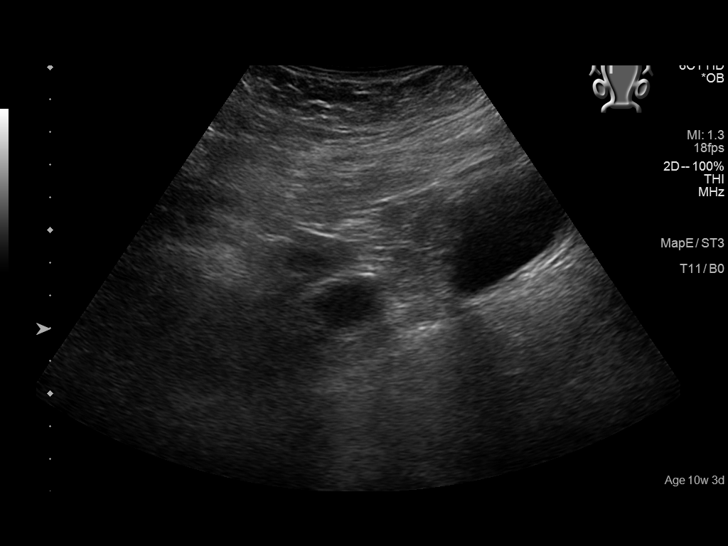
[im 19/46]
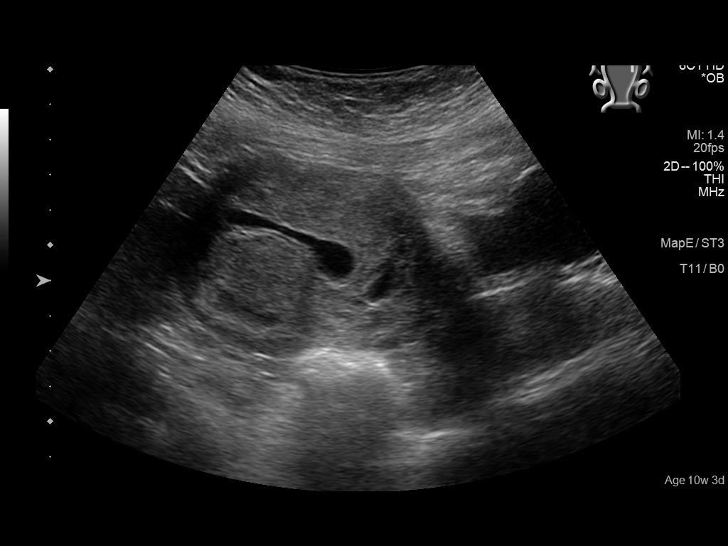
[im 22/46]
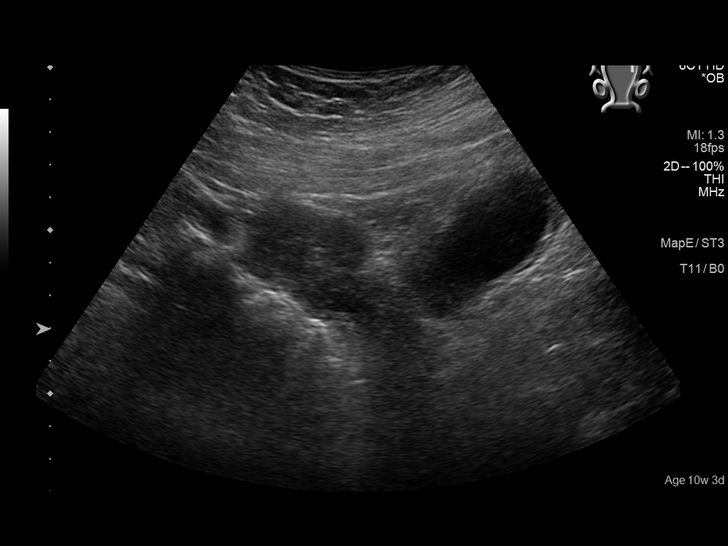
[im 26/46]
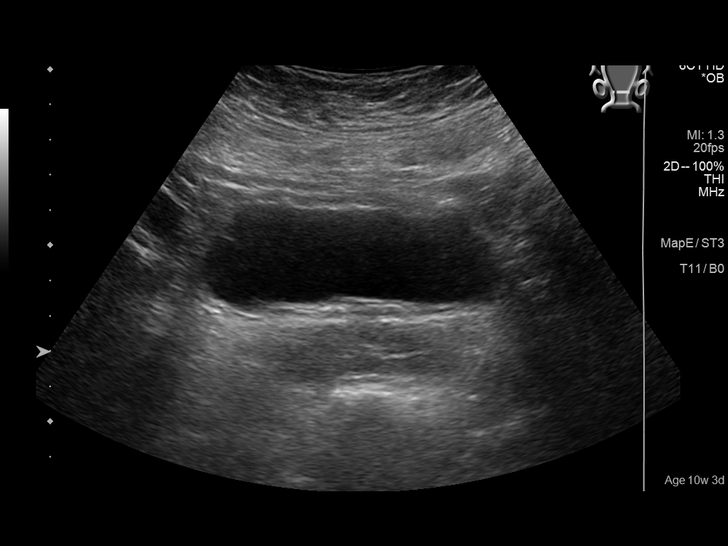
[im 29/46]
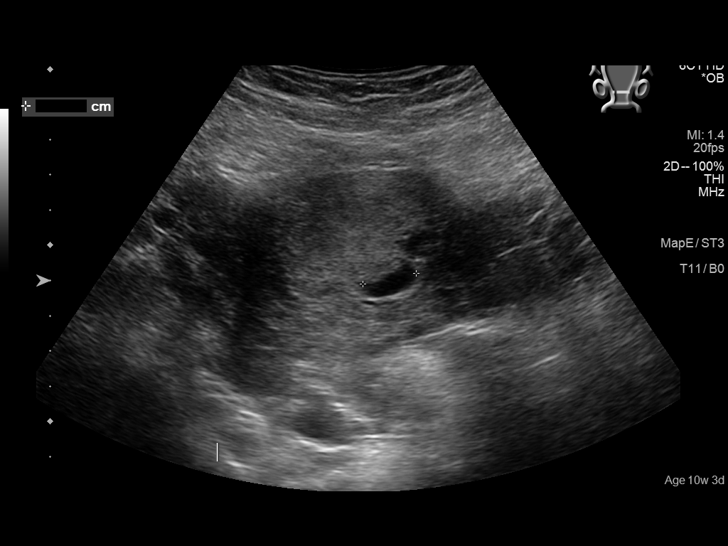
[im 32/46]
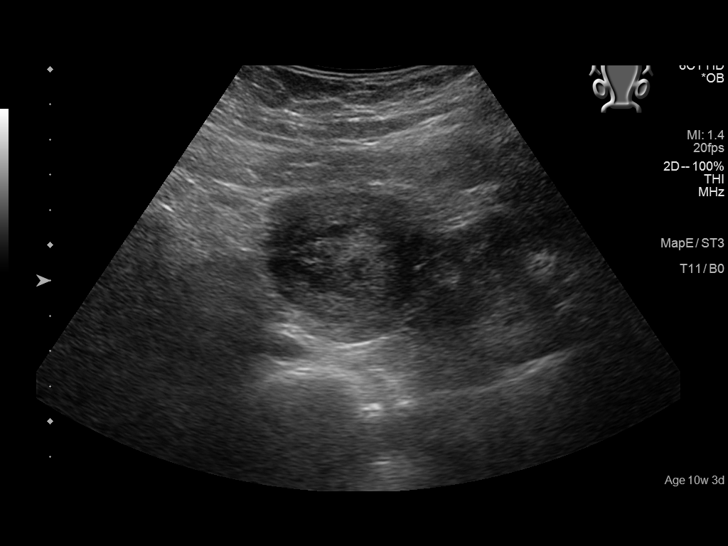
[im 36/46]
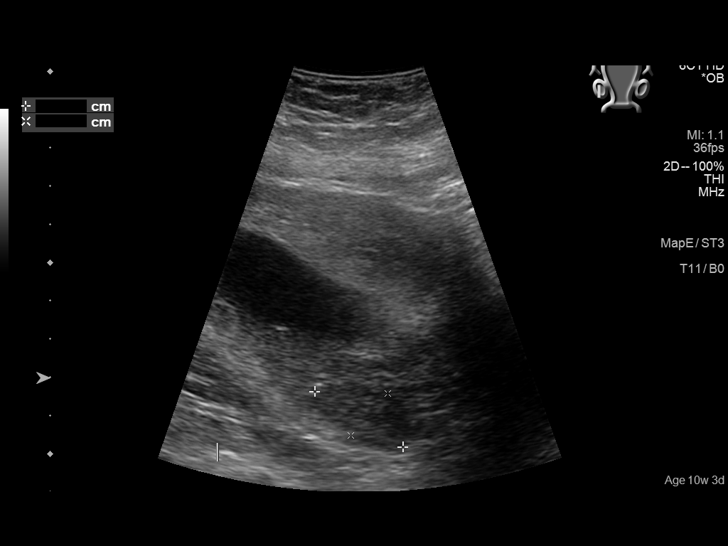
[im 39/46]
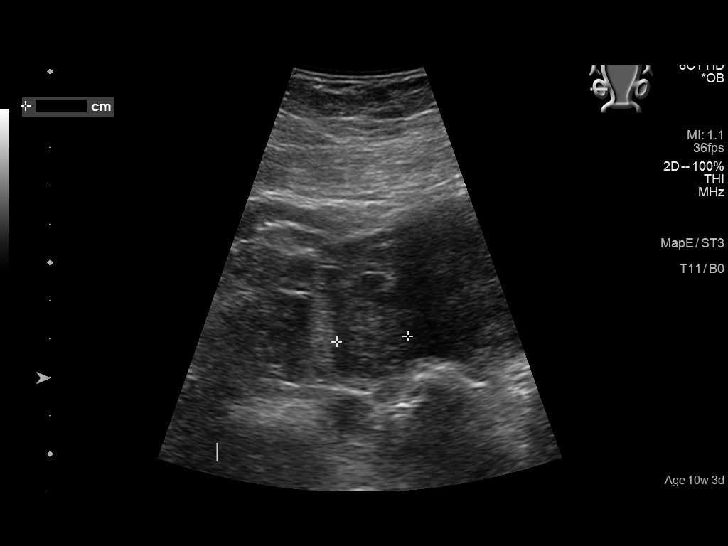
[im 42/46]
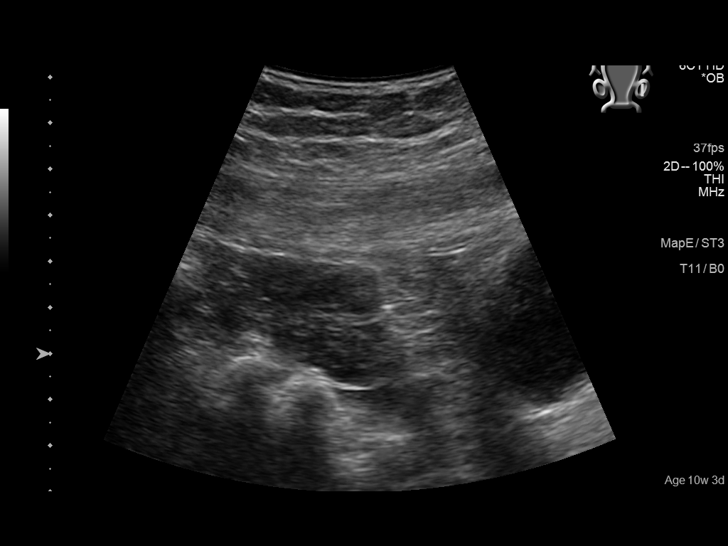
[im 46/46]
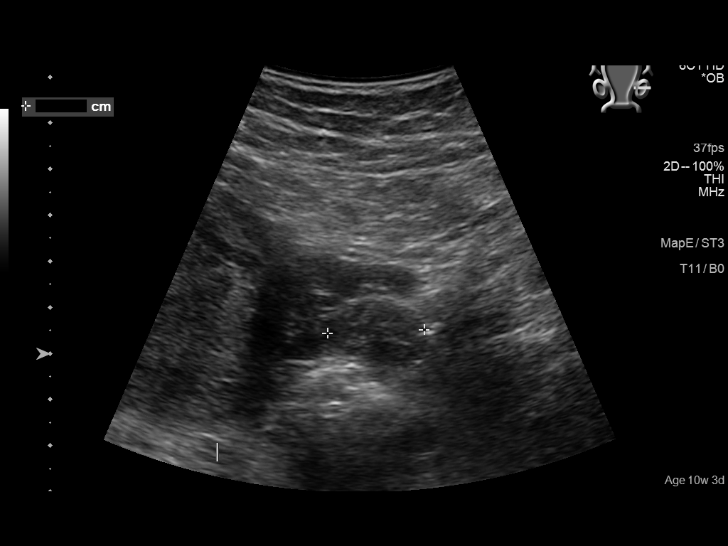

[14 of 28 positions shown; findings below may reference images not displayed]

FINDINGS: Intrauterine gestational sac: Single

Yolk sac:  Visualized

Embryo:  Visualized

Cardiac Activity: Visualized

Heart Rate: 160 bpm

CRL:  37.7 mm   10 w   5 d                  US EDC: 04/16/2022

Subchorionic hemorrhage: Small subchorionic hemorrhage along the
inferior sac. Ovaries are within normal limits. Right ovary measures
2.7 x 1.5 x 1.9 cm. Left ovary measures 2.6 x 1.3 x 2.1 cm. No
significant free fluid
IMPRESSION: 1. Single viable intrauterine pregnancy with estimated sonographic
age of 10 week 5 day and ultrasound EDC of 04/16/2022.
2. Small subchorionic hemorrhage.

## 2023-06-27 DIAGNOSIS — Z419 Encounter for procedure for purposes other than remedying health state, unspecified: Secondary | ICD-10-CM | POA: Diagnosis not present

## 2023-07-08 IMAGING — US US OB COMP LESS 14 WK
1 series · 14 of 28 positions shown · non-contrast
Comparison: September 23, 2021

CLINICAL DATA: Follow-up subchorionic hemorrhage.

EXAM:
OBSTETRIC <14 WK ULTRASOUND
TECHNIQUE: Transabdominal ultrasound was performed for evaluation of the
gestation as well as the maternal uterus and adnexal regions.

[Series 1: us ob comp less 14 wk · 14 of 51 slices shown]
[im 2/51]
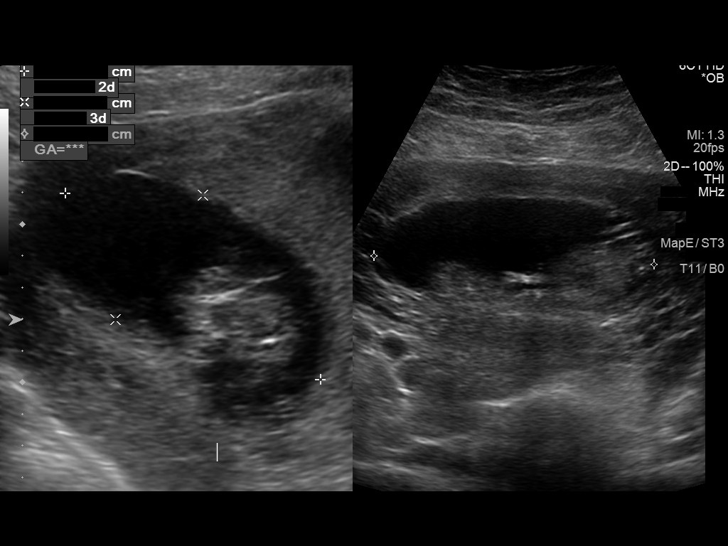
[im 6/51]
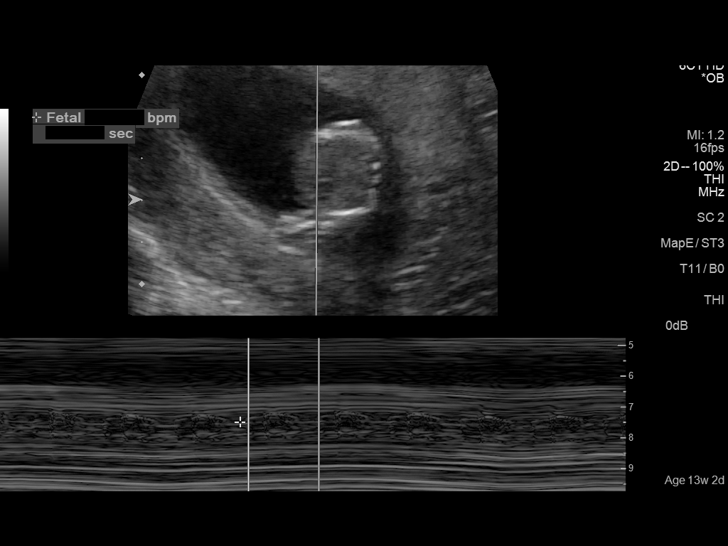
[im 10/51]
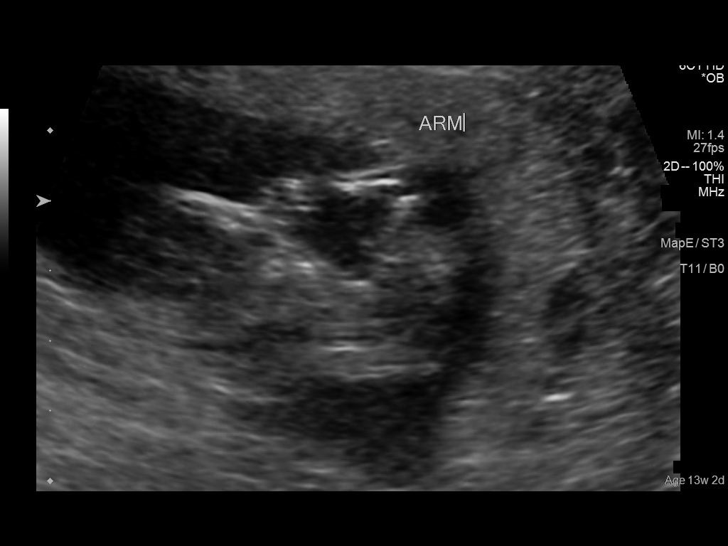
[im 13/51]
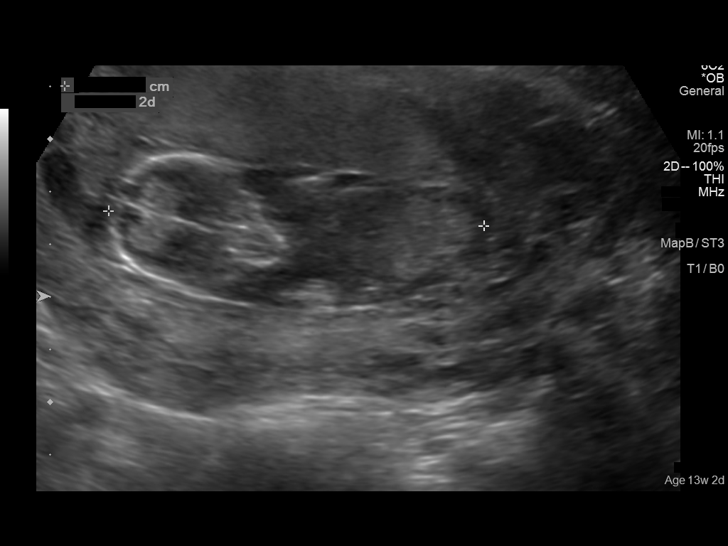
[im 17/51]
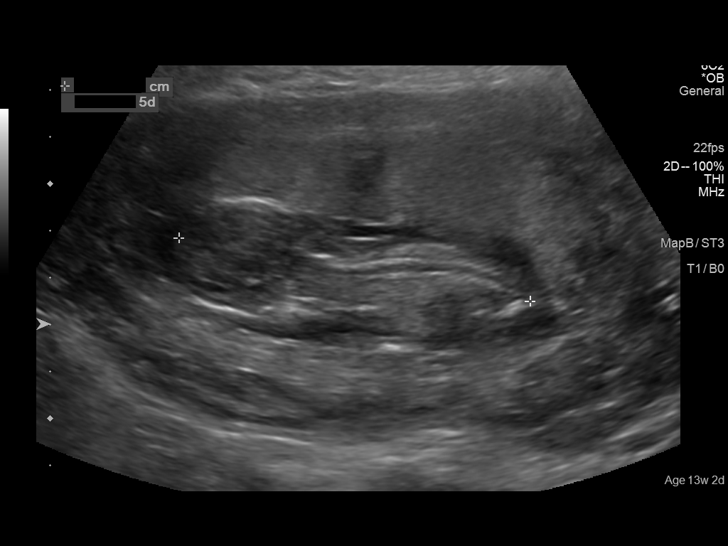
[im 21/51]
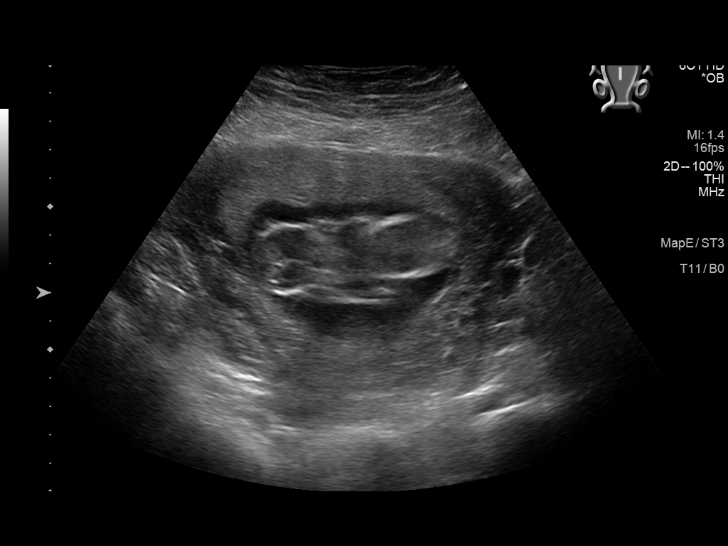
[im 25/51]
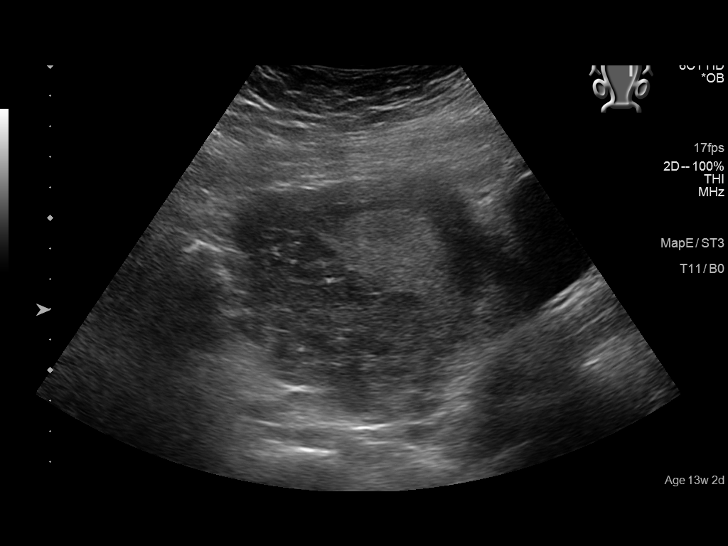
[im 28/51]
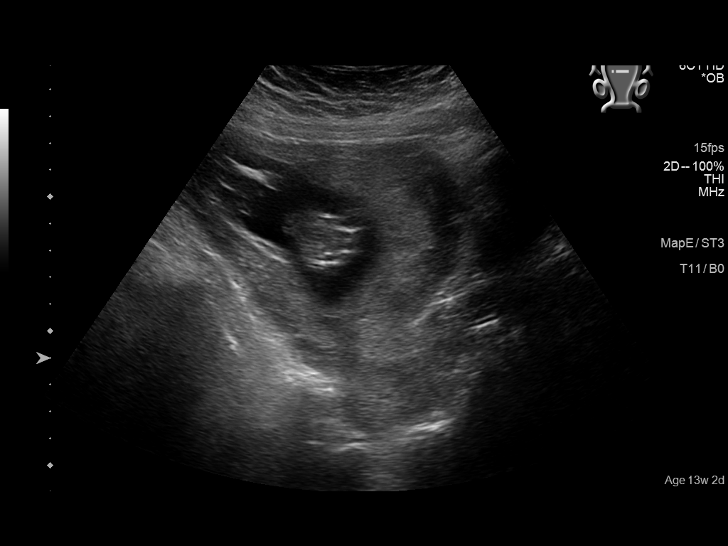
[im 32/51]
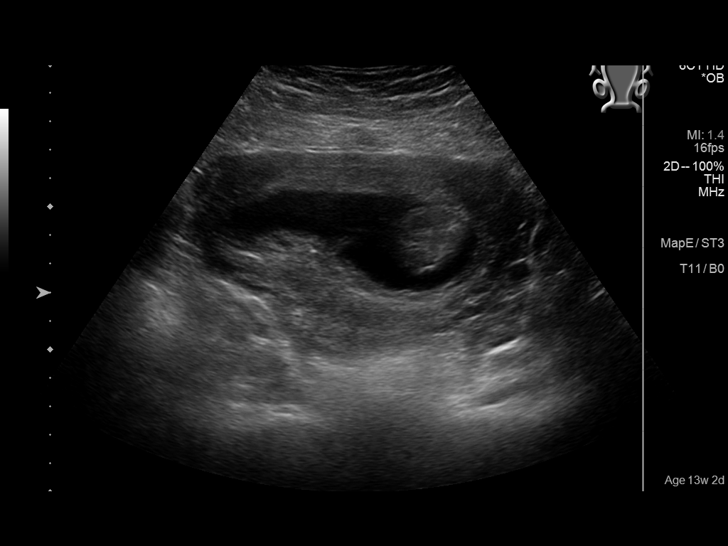
[im 36/51]
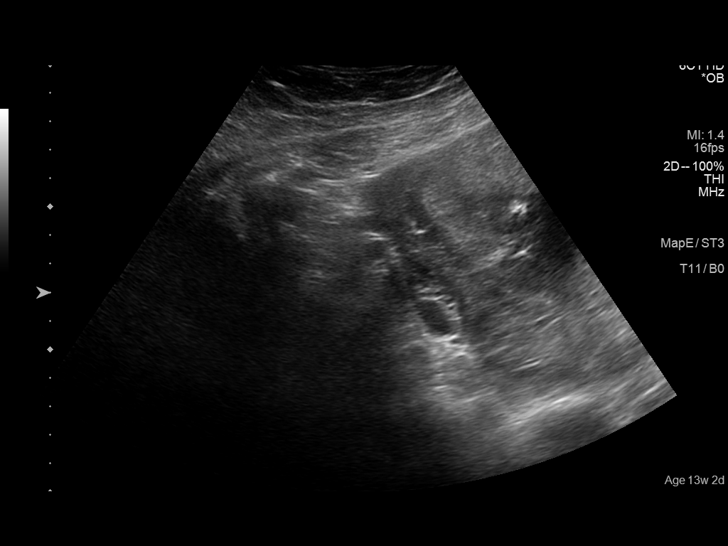
[im 39/51]
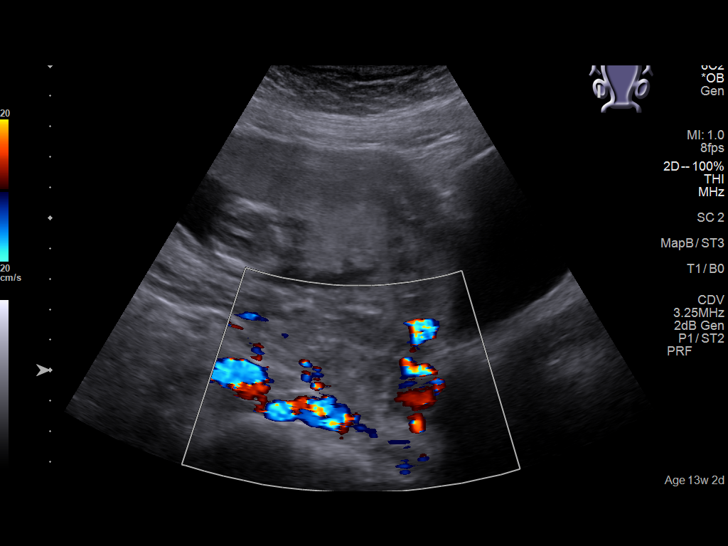
[im 43/51]
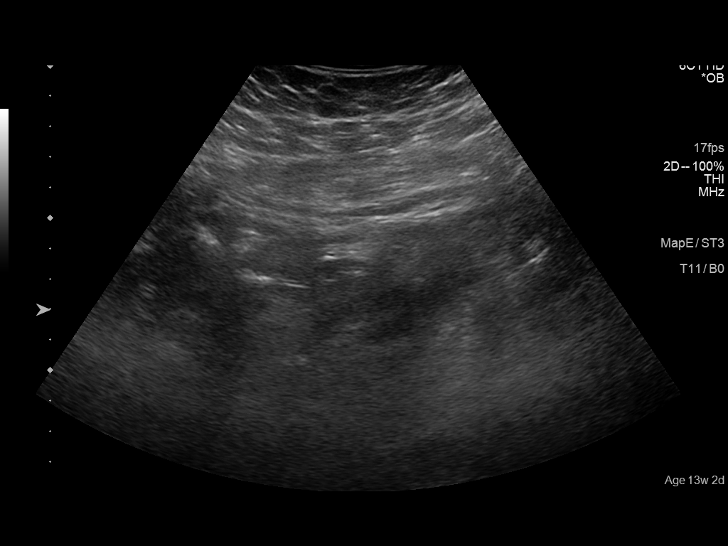
[im 47/51]
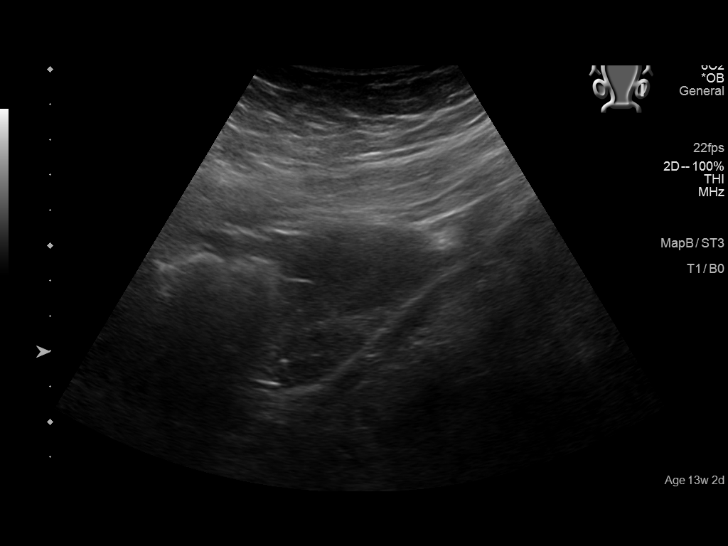
[im 51/51]
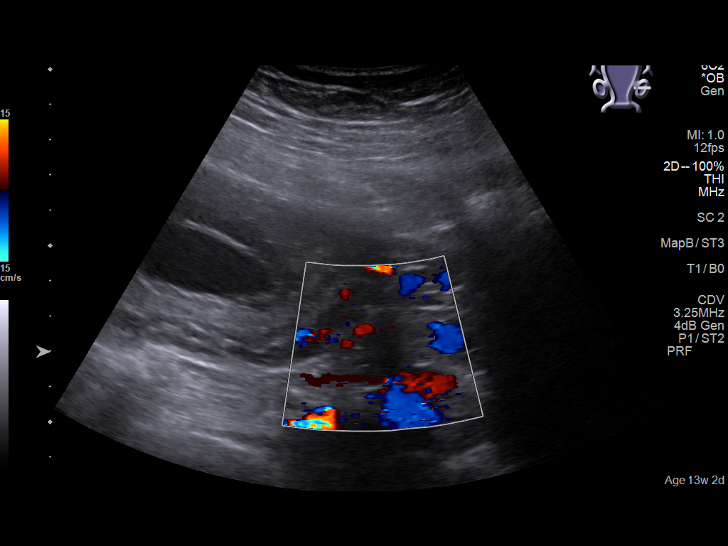

[14 of 28 positions shown; findings below may reference images not displayed]

FINDINGS: Intrauterine gestational sac: Single

Yolk sac:  Not Visualized.

Embryo:  Visualized.

Cardiac Activity: Visualized.

Heart Rate: 145 bpm

CRL:   71.4 mm   13 w 2 d                  US EDC: April 18, 2021

Subchorionic hemorrhage:  None visualized.

Maternal uterus/adnexae: The right ovary measures 3.1 cm x 1.8 cm x
2.4 cm and is normal in appearance.

The left ovary measures 2.7 cm x 1.5 cm x 1.9 cm and is normal in
appearance.

No pelvic free fluid is identified.
IMPRESSION: 1. Single, viable intrauterine pregnancy at approximately 13 weeks
and 2 days gestation by ultrasound evaluation.
2. Evidence of subchorionic hemorrhage.

## 2023-07-27 DIAGNOSIS — Z419 Encounter for procedure for purposes other than remedying health state, unspecified: Secondary | ICD-10-CM | POA: Diagnosis not present

## 2023-08-27 DIAGNOSIS — Z419 Encounter for procedure for purposes other than remedying health state, unspecified: Secondary | ICD-10-CM | POA: Diagnosis not present

## 2023-09-26 DIAGNOSIS — Z419 Encounter for procedure for purposes other than remedying health state, unspecified: Secondary | ICD-10-CM | POA: Diagnosis not present

## 2023-10-27 DIAGNOSIS — Z419 Encounter for procedure for purposes other than remedying health state, unspecified: Secondary | ICD-10-CM | POA: Diagnosis not present

## 2023-11-27 DIAGNOSIS — Z419 Encounter for procedure for purposes other than remedying health state, unspecified: Secondary | ICD-10-CM | POA: Diagnosis not present

## 2023-12-21 ENCOUNTER — Other Ambulatory Visit: Payer: Self-pay

## 2023-12-21 ENCOUNTER — Encounter: Payer: Self-pay | Admitting: *Deleted

## 2023-12-21 ENCOUNTER — Emergency Department
Admission: EM | Admit: 2023-12-21 | Discharge: 2023-12-21 | Disposition: A | Discharge status: Home / Self Care | Payer: 59 | Attending: Emergency Medicine | Admitting: Emergency Medicine

## 2023-12-21 ENCOUNTER — Emergency Department: Payer: 59

## 2023-12-21 DIAGNOSIS — M7989 Other specified soft tissue disorders: Secondary | ICD-10-CM | POA: Diagnosis not present

## 2023-12-21 DIAGNOSIS — S92524A Nondisplaced fracture of medial phalanx of right lesser toe(s), initial encounter for closed fracture: Secondary | ICD-10-CM | POA: Insufficient documentation

## 2023-12-21 DIAGNOSIS — S99921A Unspecified injury of right foot, initial encounter: Secondary | ICD-10-CM | POA: Diagnosis present

## 2023-12-21 DIAGNOSIS — W231XXA Caught, crushed, jammed, or pinched between stationary objects, initial encounter: Secondary | ICD-10-CM | POA: Insufficient documentation

## 2023-12-21 MED ORDER — IBUPROFEN 800 MG PO TABS
800.0000 mg | ORAL_TABLET | Freq: Once | ORAL | Status: AC
  Administered 2023-12-21: 800 mg via ORAL
  Filled 2023-12-21: qty 1

## 2023-12-21 MED ORDER — IBUPROFEN 600 MG PO TABS: 600.0000 mg | ORAL_TABLET | Freq: Four times a day (QID) | ORAL | 0 refills | Status: DC | PRN

## 2023-12-21 NOTE — Discharge Instructions (Signed)
 Please use postop shoe and buddy tape to ambulate.  Rest ice elevate and take ibuprofen and Tylenol as needed for pain.  Call podiatry to schedule follow-up appointment.

## 2023-12-21 NOTE — ED Triage Notes (Signed)
 Pt to triage via wheelchair.  Pt has right 4th toe pain.  Pt stumped toe getting into the shower.   Pt did not fall  Pt alert.

## 2023-12-21 NOTE — ED Provider Notes (Signed)
 [place] [facility]GENC[facility] DEPARTMENT AT [facility] Provider Note   CSN: [id number] Arrival date & time: 12/21/23  2043     History  Chief Complaint  Patient presents with   Toe Injury    Bethany Wright is a 33 y.o. female presents to the emergency department for evaluation of right fourth toe pain.  She jammed the right fourth toe into the shower just prior to arrival.  No trauma or injury to the remainder of her body.  Denies falling.  She has pain to the fourth toe with bruising and swelling.  She has not any medications for pain.  He has been ambulatory but limping.  HPI     Home Medications Prior to Admission medications   Medication Sig Start Date End Date Taking? Authorizing Provider  ibuprofen (ADVIL) 600 MG tablet Take 1 tablet (600 mg total) by mouth every 6 (six) hours as needed for moderate pain (pain score 4-6). 12/21/23  Yes Evon Slack, PA-C  norethindrone (MICRONOR) 0.35 MG tablet Take 1 tablet (0.35 mg total) by mouth daily. Start 4 weeks postpartum Patient not taking: Reported on 05/05/2022 04/20/22   Doreene Burke, CNM  Prenatal Vit-Fe Fumarate-FA (MULTIVITAMIN-PRENATAL) 27-0.8 MG TABS tablet Take 1 tablet by mouth daily at 12 noon.    [provider]      Allergies    Patient has no known allergies.    Review of Systems   Review of Systems  Physical Exam Updated Vital Signs BP (!) 143/90 (BP Location: Right Arm)   Pulse 80   Temp 98.2 F (36.8 C) (Oral)   Resp 18   Ht 5\' 7"  (1.702 m)   Wt 99.8 kg   LMP 12/14/2023 (Approximate)   SpO2 99%   BMI 34.46 kg/m  Physical Exam Constitutional:      Appearance: She is well-developed.  HENT:     Head: Normocephalic and atraumatic.  Eyes:     Conjunctiva/sclera: Conjunctivae normal.  Cardiovascular:     Rate and Rhythm: Normal rate.  Pulmonary:     Effort: Pulmonary effort is normal. No respiratory distress.  Musculoskeletal:        General: Normal range of motion.      Cervical back: Normal range of motion.     Comments: Right foot shows mild ecchymosis along the base of the fourth toe along the proximal phalanx.  No significant deformity.  No skin breakdown noted.  Nail is intact.  Sensation is intact.  Skin:    General: Skin is warm.     Findings: No rash.  Neurological:     General: No focal deficit present.     Mental Status: She is alert and oriented to person, place, and time.  Psychiatric:        Behavior: Behavior normal.        Thought Content: Thought content normal.     ED Results / Procedures / Treatments   Labs (all labs ordered are listed, but only abnormal results are displayed) Labs Reviewed - No data to display  EKG None  Radiology DG Toe 4th Right Result Date: 12/21/2023 CLINICAL DATA:  Injury to the right fourth toe. EXAM: RIGHT FOURTH TOE COMPARISON:  None Available. FINDINGS: Comminuted fractures of the midshaft proximal phalanx right fourth toe. No significant displacement of the fracture fragments. Mild soft tissue swelling. No intra-articular involvement. Joint spaces are normal. IMPRESSION: Comminuted fracture of the midshaft proximal phalanx right fourth toe. Electronically Signed   By: Burman Nieves  M.D.   On: 12/21/2023 22:19    Procedures Procedures    Medications Ordered in ED Medications  ibuprofen (ADVIL) tablet 800 mg (800 mg Oral Given 12/21/23 2320)    ED Course/ Medical Decision Making/ A&P                                 Medical Decision Making Amount and/or Complexity of Data Reviewed Radiology: ordered.  Risk Prescription drug management.   33 year old female with proximal phalanx fracture to the right fourth great toe.  Pain controlled.  Minimal swelling.  She is placed into a postop shoe and buddy tape applied to support the fourth toe to the third toe.  She will alternate Tylenol and ibuprofen as needed for pain.  Previous labs reviewed, normal kidney function.  She will rest ice and  elevate the lower extremity. Final Clinical Impression(s) / ED Diagnoses Final diagnoses:  Closed nondisplaced fracture of middle phalanx of lesser toe of right foot, initial encounter    Rx / DC Orders ED Discharge Orders          Ordered    ibuprofen (ADVIL) 600 MG tablet  Every 6 hours PRN        12/21/23 2319              Evon Slack, PA-C 12/21/23 2323    Corena Herter, MD 12/22/23 [phone]

## 2023-12-25 DIAGNOSIS — Z419 Encounter for procedure for purposes other than remedying health state, unspecified: Secondary | ICD-10-CM | POA: Diagnosis not present

## 2024-02-02 ENCOUNTER — Ambulatory Visit

## 2024-02-10 ENCOUNTER — Other Ambulatory Visit (HOSPITAL_COMMUNITY)
Admission: RE | Admit: 2024-02-10 | Discharge: 2024-02-10 | Disposition: A | Discharge status: Home / Self Care | Source: Ambulatory Visit

## 2024-02-10 ENCOUNTER — Ambulatory Visit

## 2024-02-10 VITALS — BP 126/84 | HR 67 | Ht 67.5 in | Wt 224.0 lb

## 2024-02-10 DIAGNOSIS — F32A Depression, unspecified: Secondary | ICD-10-CM | POA: Insufficient documentation

## 2024-02-10 DIAGNOSIS — Z124 Encounter for screening for malignant neoplasm of cervix: Secondary | ICD-10-CM | POA: Diagnosis present

## 2024-02-10 DIAGNOSIS — Z0000 Encounter for general adult medical examination without abnormal findings: Secondary | ICD-10-CM

## 2024-02-10 DIAGNOSIS — F419 Anxiety disorder, unspecified: Secondary | ICD-10-CM | POA: Insufficient documentation

## 2024-02-10 DIAGNOSIS — Z01419 Encounter for gynecological examination (general) (routine) without abnormal findings: Secondary | ICD-10-CM | POA: Diagnosis not present

## 2024-02-10 DIAGNOSIS — A64 Unspecified sexually transmitted disease: Secondary | ICD-10-CM | POA: Insufficient documentation

## 2024-02-10 MED ORDER — SERTRALINE HCL 50 MG PO TABS: 50.0000 mg | ORAL_TABLET | Freq: Every day | ORAL | 6 refills | Status: DC

## 2024-02-10 NOTE — Assessment & Plan Note (Signed)
 Discussed concerns for difficulty losing weight following last pregnancy. No recent sudden weight gains. Has been exercising routinely and making diet changes with no improvement in weight.  Offered follow up with other providers in office who provide weight management intervention or with her PCP.  Pap screening completed today. Swabs for GC/C, BV, Yeast collected

## 2024-02-10 NOTE — Progress Notes (Addendum)
 Outpatient Gynecology Note: Annual Visit  Assessment/Plan:    Bethany Wright is a 33 y.o. female [phone] with normal well-woman gynecologic exam.   Anxiety and depression Discussed worsening symptoms of depression and anxiety. Pt currently working with therapist who did advise pt to consider medication intervention.  PHQ-9=11 GAD-7=16 Pt has been trying supplements such as Ashwaganda with no improvement. We also discussed use of St. Johns Wort if desired prior to starting medication.  Discussed starting Zoloft and continuing with therapist, new prescription sent to patients pharmacy. Advised pt to make 6 week follow up after starting Zoloft for monitoring.   Well woman exam Discussed concerns for difficulty losing weight following last pregnancy. No recent sudden weight gains. Has been exercising routinely and making diet changes with no improvement in weight.  Offered follow up with other providers in office who provide weight management intervention or with her PCP.  Pap screening completed today. Swabs for GC/C, BV, Yeast collected     No orders of the defined types were placed in this encounter.  Current Outpatient Medications  Medication Instructions   ibuprofen (ADVIL) 600 mg, Oral, Every 6 hours PRN   norethindrone (MICRONOR) 0.35 mg, Oral, Daily, Start 4 weeks postpartum   Prenatal Vit-Fe Fumarate-FA (MULTIVITAMIN-PRENATAL) 27-0.8 MG TABS tablet 1 tablet, Daily   sertraline (ZOLOFT) 50 mg, Oral, Daily, Take half a table (25 mg) for 7 days. Then increase to full dose of 50 mg daily.    Return in about 6 weeks (around 03/23/2024) for Medication managment.    Subjective:    Bethany Wright is a 33 y.o. female G2P2002 who presents for annual wellness visit.   Occupation Herbalist    Lives with kids, partner  CONCERNS? Difficulty losing weight from last pregnancy. Well Woman Visit:  GYN HISTORY:  Patient's last menstrual period was 02/01/2024.      Menstrual History: OB History     Gravida  2   Para  2   Term  2   Preterm      AB      Living  2      SAB      IAB      Ectopic      Multiple  0   Live Births  2           Menarche age: unknown Patient's last menstrual period was 02/01/2024. Period Cycle (Days): 28 Period Duration (Days): 4-5 Period Pattern: Regular Menstrual Flow: Moderate Menstrual Control: Panty liner, Thin pad, Maxi pad Menstrual Control Change Freq (Hours): 2-4 Dysmenorrhea: (!) Mild Dysmenorrhea Symptoms: Cramping Urinary incontinence: No  Sexually active: yes Number of sexual partners: 1 Gender of sexual Partners: male Dyspareunia? no Last pap:was normal  History of abnormal Pap: none Gardasil series:  unknown STI history: no STI/HIV testing or immunizations needed? Yes, swabs for GC/Ch BV, yeast requested today  Contraceptive methods: no method  Health Maintenance > Reviewed breast self-awareness > History of abnormal mammogram: No- No family hx of breast or cervical cancers > Exercise: cardiovascular workout on exercise equipment and weightlifting, moderately active > Dietary Supplements: Folate: No;  Calcium: No}; Vitamin D: No > Body mass index is 34.57 kg/m.  > Recent dental visit Yes.   > Seat Belt Use: Yes.   > Texting and driving? No. > Safe in current relationship? Yes.   > Concern for alcohol abuse? No   Tobacco or other drug use: denied. Tobacco Use: Low Risk  (02/10/2024)   Patient  History    Smoking Tobacco Use: Never    Smokeless Tobacco Use: Never    Passive Exposure: Not on file     PHQ-2 Score: In last two weeks, how often have you felt: Little interest or pleasure in doing things: Nearly every day (+3) Feeling down, depressed or hopeless: Several days (+1) Score: 4  GAD-2 Over the last 2 weeks, how often have you been bothered by the following problems? Feeling nervous, anxious or on edge: Nearly every day (+3) Not being able to stop or  control worrying: Nearly every day (+3)} Score:6   _________________________________________________________  Current Outpatient Medications  Medication Sig Dispense Refill   sertraline (ZOLOFT) 50 MG tablet Take 1 tablet (50 mg total) by mouth daily. Take half a table (25 mg) for 7 days. Then increase to full dose of 50 mg daily. 30 tablet 6   ibuprofen (ADVIL) 600 MG tablet Take 1 tablet (600 mg total) by mouth every 6 (six) hours as needed for moderate pain (pain score 4-6). (Patient not taking: Reported on 02/10/2024) 30 tablet 0   norethindrone (MICRONOR) 0.35 MG tablet Take 1 tablet (0.35 mg total) by mouth daily. Start 4 weeks postpartum (Patient not taking: Reported on 02/10/2024) 30 tablet 11   Prenatal Vit-Fe Fumarate-FA (MULTIVITAMIN-PRENATAL) 27-0.8 MG TABS tablet Take 1 tablet by mouth daily at 12 noon. (Patient not taking: Reported on 02/10/2024)     No current facility-administered medications for this visit.   No Known Allergies  Past Medical History:  Diagnosis Date   Medical history non-contributory    Past Surgical History:  Procedure Laterality Date   NO PAST SURGERIES     OB History     Gravida  2   Para  2   Term  2   Preterm      AB      Living  2      SAB      IAB      Ectopic      Multiple  0   Live Births  2          Social History   Tobacco Use   Smoking status: Never   Smokeless tobacco: Never  Substance Use Topics   Alcohol use: No   Social History   Substance and Sexual Activity  Sexual Activity Not Currently   Birth control/protection: None    Immunization History  Administered Date(s) Administered   Influenza-Unspecified 08/25/2020, 08/29/2021     Review Of Systems  Constitutional: Denied constitutional symptoms, night sweats, recent illness, fatigue, fever, insomnia and weight loss.  Eyes: Denied eye symptoms, eye pain, photophobia, vision change and visual disturbance.  Ears/Nose/Throat/Neck: Denied ear,  nose, throat or neck symptoms, hearing loss, nasal discharge, sinus congestion and sore throat.  Cardiovascular: Denied cardiovascular symptoms, arrhythmia, chest pain/pressure, edema, exercise intolerance, orthopnea and palpitations.  Respiratory: Denied pulmonary symptoms, asthma, pleuritic pain, productive sputum, cough, dyspnea and wheezing.  Gastrointestinal: Denied, gastro-esophageal reflux, melena, nausea and vomiting.  Genitourinary: Denied genitourinary symptoms including symptomatic vaginal discharge, pelvic relaxation issues, and urinary complaints.  Musculoskeletal: Denied musculoskeletal symptoms, stiffness, swelling, muscle weakness and myalgia.  Dermatologic: Denied dermatology symptoms, rash and scar.  Neurologic: Denied neurology symptoms, dizziness, headache, neck pain and syncope.  Psychiatric: Denied psychiatric symptoms, anxiety and depression.  Endocrine: Denied endocrine symptoms including hot flashes and night sweats.      Objective:    BP 126/84   Pulse 67   Ht 5' 7.5" (1.715 m)  Wt 224 lb (101.6 kg)   LMP 02/01/2024   Breastfeeding No   BMI 34.57 kg/m   Constitutional: Well-developed, well-nourished female in no acute distress Neurological: Alert and oriented to person, place, and time Psychiatric: Mood and affect appropriate Skin: No rashes or lesions Neck: Supple without masses. Trachea is midline.Thyroid is normal size without masses Lymphatics: No cervical, axillary, supraclavicular, or inguinal adenopathy noted Respiratory: Clear to auscultation bilaterally. Good air movement with normal work of breathing. Cardiovascular: Regular rate and rhythm. Extremities grossly normal, nontender with no edema; pulses regular Gastrointestinal: Soft, nontender, nondistended. No masses or hernias appreciated. No hepatosplenomegaly. No fluid wave. No rebound or guarding. Breast Exam: normal appearance, no masses or tenderness exam deferred  Genitourinary:          External Genitalia: Normal female genitalia    Vagina: , normal skin color and appearance, no lesions.    Cervix: No lesions, normal size and consistency; no cervical motion tenderness     Uterus: Normal size and contour; smooth, mobile, NT. Adnexae: Non-palpable and non-tender Perineum/Anus: No lesions Rectal: deferred   Alba Ally  02/10/24 11:12 AM

## 2024-02-10 NOTE — Assessment & Plan Note (Addendum)
 Discussed worsening symptoms of depression and anxiety. Pt currently working with therapist who did advise pt to consider medication intervention.  PHQ-9=11 GAD-7=16 Pt has been trying supplements such as Ashwaganda with no improvement. We also discussed use of St. Johns Wort if desired prior to starting medication.  Discussed starting Zoloft and continuing with therapist, new prescription sent to patients pharmacy. Advised pt to make 6 week follow up after starting Zoloft for monitoring.

## 2024-02-11 LAB — CERVICOVAGINAL ANCILLARY ONLY
Bacterial Vaginitis (gardnerella): NEGATIVE
Candida Glabrata: NEGATIVE
Candida Vaginitis: POSITIVE — AB
Chlamydia: NEGATIVE
Comment: NEGATIVE
Comment: NEGATIVE
Comment: NEGATIVE
Comment: NEGATIVE
Comment: NEGATIVE
Comment: NORMAL
Neisseria Gonorrhea: NEGATIVE
Trichomonas: NEGATIVE

## 2024-02-14 ENCOUNTER — Other Ambulatory Visit: Payer: Self-pay

## 2024-02-14 MED ORDER — FLUCONAZOLE 150 MG PO TABS: 150.0000 mg | ORAL_TABLET | Freq: Once | ORAL | 3 refills | Status: AC

## 2024-02-15 LAB — CYTOLOGY - PAP
Comment: NEGATIVE
Diagnosis: NEGATIVE
Diagnosis: REACTIVE
High risk HPV: NEGATIVE

## 2024-03-22 ENCOUNTER — Ambulatory Visit: Payer: Self-pay

## 2024-06-21 ENCOUNTER — Telehealth: Payer: Self-pay

## 2024-06-21 NOTE — Telephone Encounter (Signed)
 Patient needs to contact us 

## 2024-11-23 ENCOUNTER — Other Ambulatory Visit (HOSPITAL_COMMUNITY)
Admission: RE | Admit: 2024-11-23 | Discharge: 2024-11-23 | Disposition: A | Source: Ambulatory Visit | Attending: Obstetrics | Admitting: Obstetrics

## 2024-11-23 ENCOUNTER — Ambulatory Visit

## 2024-11-23 VITALS — BP 122/64 | HR 78 | Ht 67.5 in | Wt 225.0 lb

## 2024-11-23 DIAGNOSIS — N898 Other specified noninflammatory disorders of vagina: Secondary | ICD-10-CM

## 2024-11-23 DIAGNOSIS — R102 Pelvic and perineal pain unspecified side: Secondary | ICD-10-CM | POA: Diagnosis present

## 2024-11-23 DIAGNOSIS — B9689 Other specified bacterial agents as the cause of diseases classified elsewhere: Secondary | ICD-10-CM

## 2024-11-23 DIAGNOSIS — R350 Frequency of micturition: Secondary | ICD-10-CM

## 2024-11-23 DIAGNOSIS — N760 Acute vaginitis: Secondary | ICD-10-CM | POA: Insufficient documentation

## 2024-11-23 DIAGNOSIS — R3129 Other microscopic hematuria: Secondary | ICD-10-CM

## 2024-11-23 LAB — POCT URINALYSIS DIPSTICK
Bilirubin, UA: NEGATIVE
Glucose, UA: NEGATIVE
Ketones, UA: NEGATIVE
Leukocytes, UA: NEGATIVE
Nitrite, UA: NEGATIVE
Protein, UA: NEGATIVE
Spec Grav, UA: 1.015
Urobilinogen, UA: 1 U/dL
pH, UA: 6

## 2024-11-23 MED ORDER — METRONIDAZOLE 500 MG PO TABS: 500.0000 mg | ORAL_TABLET | Freq: Two times a day (BID) | ORAL | 0 refills | Status: AC

## 2024-11-23 NOTE — Progress Notes (Signed)
" ° ° °  NURSE VISIT NOTE  Subjective:    Patient ID: Bethany Wright, female    DOB: Mar 29, 1991, 34 y.o.   MRN: [id number]  HPI  Patient is a 34 y.o. G64P2002 female who presents for  vaginal odor for 3 week(s). Has a history of BV and believes the odor is BV. Denies abnormal vaginal bleeding. Admits to  urinary urgency, pelvic pain, and feeling like she can not emptying  her bladder . Patient denies a history of known exposure to STD.   Objective:    BP 122/64   Pulse 78   Ht 5' 7.5 (1.715 m)   Wt 225 lb (102.1 kg)   LMP 10/29/2024   BMI 34.72 kg/m    Results for orders placed or performed in visit on 11/23/24  POCT urinalysis dipstick  Result Value Ref Range   Color, UA     Clarity, UA     Glucose, UA Negative Negative   Bilirubin, UA Negative    Ketones, UA Negative    Spec Grav, UA 1.015 1.010 - 1.025   Blood, UA trace    pH, UA 6.0 5.0 - 8.0   Protein, UA Negative Negative   Urobilinogen, UA 1.0 0.2 or 1.0 E.U./dL   Nitrite, UA Negative    Leukocytes, UA Negative Negative   Appearance     Odor      Assessment:   1. Urine frequency   2. BV (bacterial vaginosis)   3. Vaginal odor   4. Pelvic pain   5. Other microscopic hematuria       Plan:   GC and chlamydia DNA  probe sent to lab. Urine culture and urinalysis sent to lab  Treatment: abstain from coitus during course of treatment and metronidazole  sent for BV. ROV prn if symptoms persist or worsen. Pt aware if urine results come back normal she needs to see the provider for further treatment if symptoms are still present.   Harlene Gander, CMA  "

## 2024-11-24 LAB — CERVICOVAGINAL ANCILLARY ONLY
Bacterial Vaginitis (gardnerella): NEGATIVE
Candida Glabrata: NEGATIVE
Candida Vaginitis: NEGATIVE
Chlamydia: NEGATIVE
Comment: NEGATIVE
Comment: NEGATIVE
Comment: NEGATIVE
Comment: NEGATIVE
Comment: NEGATIVE
Comment: NORMAL
Neisseria Gonorrhea: NEGATIVE
Trichomonas: NEGATIVE

## 2024-11-24 LAB — URINALYSIS, ROUTINE W REFLEX MICROSCOPIC
Bilirubin, UA: NEGATIVE
Glucose, UA: NEGATIVE
Ketones, UA: NEGATIVE
Leukocytes,UA: NEGATIVE
Nitrite, UA: NEGATIVE
Protein,UA: NEGATIVE
RBC, UA: NEGATIVE
Urobilinogen, Ur: 0.2 mg/dL (ref 0.2–1.0)
pH, UA: 5.5 (ref 5.0–7.5)

## 2024-11-25 LAB — URINE CULTURE

## 2024-11-28 ENCOUNTER — Ambulatory Visit: Payer: Self-pay
